# Patient Record
Sex: Male | Born: 1955 | Race: White | Hispanic: No | Marital: Married | Smoking: Former smoker
Health system: Southern US, Community
[De-identification: ages and names within clinical notes are randomized; demographics above are authoritative.]

## PROBLEM LIST (undated history)

## (undated) DIAGNOSIS — I10 Essential (primary) hypertension: Secondary | ICD-10-CM

## (undated) DIAGNOSIS — M1990 Unspecified osteoarthritis, unspecified site: Secondary | ICD-10-CM

## (undated) DIAGNOSIS — J189 Pneumonia, unspecified organism: Secondary | ICD-10-CM

## (undated) HISTORY — PX: COLONOSCOPY: SHX174

## (undated) HISTORY — PX: SHOULDER SURGERY: SHX246

---

## 2003-01-03 ENCOUNTER — Ambulatory Visit (HOSPITAL_BASED_OUTPATIENT_CLINIC_OR_DEPARTMENT_OTHER): Admission: RE | Admit: 2003-01-03 | Discharge: 2003-01-03 | Payer: Self-pay | Admitting: Orthopaedic Surgery

## 2005-08-12 ENCOUNTER — Ambulatory Visit: Payer: Self-pay | Admitting: Family Medicine

## 2005-09-03 ENCOUNTER — Ambulatory Visit: Payer: Self-pay | Admitting: Family Medicine

## 2010-06-12 ENCOUNTER — Encounter: Admission: RE | Admit: 2010-06-12 | Discharge: 2010-06-12 | Payer: Self-pay | Admitting: Internal Medicine

## 2011-01-17 NOTE — Op Note (Signed)
Tommy Jones, Tommy Jones                            ACCOUNT NO.:  [id number]   MEDICAL RECORD NO.:  [id number]                   PATIENT TYPE:  AMB   LOCATION:  DSC                                  FACILITY:  [facility]   PHYSICIAN:  Lubertha Basque. Jerl Santos, M.D.             DATE OF BIRTH:  Apr 30, 1956   DATE OF PROCEDURE:  01/03/2003  DATE OF DISCHARGE:                                 OPERATIVE REPORT   PREOPERATIVE DIAGNOSES:  1. Right shoulder impingement.  2. Right shoulder acromioclavicular pain.  3. Right shoulder degenerative joint disease.   POSTOPERATIVE DIAGNOSES:  1. Right shoulder impingement.  2. Right shoulder acromioclavicular pain.  3. Right shoulder degenerative joint disease.   PROCEDURE:  1. Right shoulder arthroscopic acromioplasty.  2. Right shoulder arthroscopic acromioclavicular resection.  3. Right shoulder arthroscopic debridement and removal loose body.   ANESTHESIA:  General.   SURGEON:  Marcene Corning, M.D.   ASSISTANT:  Laray Anger, P.A.   INDICATIONS FOR PROCEDURE:  The patient is a 55 year-old male with a long  history of bilateral shoulder difficulty.  He has responded in the past in a  transient way to subacromial injection on the right where he has pain  consistent with impingement.  He also has some AC pain.  On the left side he  has endstage degenerative change with lesser changes seen on this operative  right side.  With pain at rest and pain with activity and specifically  difficulty throwing, he is offered an arthroscopy.  Informed operative  consent was obtained after discussion of possible complications of reaction  to anesthesia and infection.   DESCRIPTION OF PROCEDURE:  The patient was taken to the operating suite  where general anesthetic is applied without difficulty.  He was positioned  in beach chair position and prepped and draped in normal sterile fashion.  After the administration of preoperative antibiotics an arthroscopy of the  right shoulder was performed through a total of three portals.  Glenohumeral  joint did show some significant degenerative change which was focally grade  4 and in a broader area grade 3.  There was some loose articular flaps of  cartilage which we removed.  There was also evidence of some remodeling  going on with some fibrocartilage forming across his glenoid and to a lesser  degree across the humeral head.  Some loose bodies were removed the largest  of which measured about 4 mm in diameter.  The labrum appeared to be intact  and the biceps anchor was intact as well and stable.  The rotator cuff  appeared benign from below and the biceps tendon was traced out through the  rotator interval and was found to be benign across the intraarticular  course.  In the subacromial space he had a great deal of bursitis and  inflammation of the cuff but no frank tear.  He had a prominent subacromial  morphology addressed with an acromioplasty back to a flat surface.  This was  done with the burr in the lateral position followed by transfer of the burr  to the posterior position.  There was also bone-on-bone contact at the [facility]  joint and a formal ACB compression was done through the anterior portal.  The shoulder was thoroughly irrigated at the end of the case followed by  placement of Marcaine with epinephrine and morphine.  Simple sutures of  nylon were used to loosely reapproximate the portals followed by Adaptic and  a dry gauze dressing with tape.   ESTIMATED BLOOD LOSS:  Obtain from anesthesia records.   INTRAOPERATIVE FLUIDS:  Obtain from anesthesia records.   DISPOSITION:  The patient was extubated in the operating room and taken to  recovery in stable condition.  Plans were for him to go home the same day  and to follow up in the office in less than a week.  I will contact him by  phone tonight.                                               Lubertha Basque Jerl Santos, M.D.    PGD/MEDQ  D:   01/03/2003  T:  01/03/2003  Job:  [id number]

## 2013-12-08 ENCOUNTER — Ambulatory Visit
Admission: RE | Admit: 2013-12-08 | Discharge: 2013-12-08 | Disposition: A | Discharge status: Home / Self Care | Payer: BC Managed Care – PPO | Source: Ambulatory Visit | Attending: Internal Medicine | Admitting: Internal Medicine

## 2013-12-08 ENCOUNTER — Other Ambulatory Visit: Payer: Self-pay | Admitting: Internal Medicine

## 2013-12-08 DIAGNOSIS — M25559 Pain in unspecified hip: Secondary | ICD-10-CM

## 2013-12-08 DIAGNOSIS — M25552 Pain in left hip: Secondary | ICD-10-CM

## 2014-04-14 NOTE — H&P (Signed)
  [facility] [address]   [address] [place], [place] [id number] [phone] A Division of [facility]  Loreta Aveaniel F. Ivet Guerrieri, M.D.   Robert A. Thurston HoleWainer, M.D.   Burnell BlanksW. Dan Caffrey, M.D.   Eulas PostJoshua P. Landau, M.D.   Lunette StandsAnna Voytek, M.D Jewel Baizeimothy D. Eulah PontMurphy, M.D.  Buford DresserWesley R. Ibazebo, M.D.  Estell HarpinJames S. Kramer, M.D.    Melina Fiddlerebecca S. Bassett, M.D. Mary L. Isidoro DonningAnton, PA-C  Kirstin A. Shepperson, PA-C  Josh [place], PA-C StewardBrandon Parry, [place]A-C                                                                    RE: Tommy Jones, Steve                                    [phone]      DOB: 1955/10/21 PROGRESS NOTE: 02-20-14 Reason for visit: Referral from Dr. Thurston HoleWainer to evaluate left hip pain.   History of present illness: He has had progressive pain in his left hip at the groin for a year or so.  he has had one injection there.  He has been on NSAIDs.  He has pain daily that is limiting his ability to work.     Please see associated documentation for this clinic visit for further past medical, family, surgical and social history, review of systems, and exam findings as this was reviewed by me.  EXAMINATION: Well appearing male in no apparent distress.  He has a positive Stinchfield.  Pain with range of motion, but relatively well preserved.  Range of motion, he has good pulses in his left lower extremity.  He has no tenderness in his back.  Negative straight leg raise.  No knee pain.     X-RAYS: X-rays reviewed by me: plain films demonstrate severe arthritis of the left hip.  He also had an MRI which demonstrated a significant amount of signal within the head.  There is a question whether it was AVN versus just reactive changes to the arthritis.    ASSESSMENT/PLAN: Pleasant 58 year-old gentleman with severe arthritis of the left hip.  Unclear whether it was AVN or just reactive changes to the arthritis.  I discussed his options at this time.  We can repeat his injection,  although it only lasted for a week.  He can continue with NSAIDs.  I will give him some Ultram, but he would like to go forward with total hip arthroplasty.  I discussed the risks and benefits.  He would like to go forward with this.  Of note, he does have severe arthritis in the left shoulder and would like that eventually taken care of.  We will still need to get a better axillary x-ray and possibly a CT scan for surgical planning for that.    Jewel Baizeimothy D.  Eulah PontMurphy, M.D.  Electronically verified by Jewel Baizeimothy D. Eulah PontMurphy, M.D. TDM:jjh D 02-20-14 T 02-20-14

## 2014-04-21 ENCOUNTER — Other Ambulatory Visit (HOSPITAL_COMMUNITY): Payer: Self-pay | Admitting: *Deleted

## 2014-04-21 ENCOUNTER — Encounter (HOSPITAL_COMMUNITY)
Admission: RE | Admit: 2014-04-21 | Discharge: 2014-04-21 | Disposition: A | Discharge status: Home / Self Care | Payer: BC Managed Care – PPO | Source: Ambulatory Visit | Attending: Orthopedic Surgery | Admitting: Orthopedic Surgery

## 2014-04-21 ENCOUNTER — Encounter (HOSPITAL_COMMUNITY): Payer: Self-pay

## 2014-04-21 ENCOUNTER — Encounter (HOSPITAL_COMMUNITY)
Admission: RE | Admit: 2014-04-21 | Discharge: 2014-04-21 | Disposition: A | Discharge status: Home / Self Care | Payer: BC Managed Care – PPO | Source: Ambulatory Visit | Attending: Anesthesiology | Admitting: Anesthesiology

## 2014-04-21 DIAGNOSIS — M1610 Unilateral primary osteoarthritis, unspecified hip: Secondary | ICD-10-CM | POA: Diagnosis present

## 2014-04-21 DIAGNOSIS — Z01818 Encounter for other preprocedural examination: Secondary | ICD-10-CM | POA: Insufficient documentation

## 2014-04-21 DIAGNOSIS — M169 Osteoarthritis of hip, unspecified: Secondary | ICD-10-CM | POA: Diagnosis present

## 2014-04-21 HISTORY — DX: Essential (primary) hypertension: I10

## 2014-04-21 HISTORY — DX: Pneumonia, unspecified organism: J18.9

## 2014-04-21 HISTORY — DX: Unspecified osteoarthritis, unspecified site: M19.90

## 2014-04-21 LAB — CBC
HCT: 43.7 % (ref 39.0–52.0)
Hemoglobin: 15.3 g/dL (ref 13.0–17.0)
MCH: 33.6 pg (ref 26.0–34.0)
MCHC: 35 g/dL (ref 30.0–36.0)
MCV: 96 fL (ref 78.0–100.0)
Platelets: 238 10*3/uL (ref 150–400)
RBC: 4.55 MIL/uL (ref 4.22–5.81)
RDW: 12.5 % (ref 11.5–15.5)
WBC: 6.5 10*3/uL (ref 4.0–10.5)

## 2014-04-21 LAB — URINALYSIS, ROUTINE W REFLEX MICROSCOPIC
BILIRUBIN URINE: NEGATIVE
Glucose, UA: NEGATIVE mg/dL
Hgb urine dipstick: NEGATIVE
Ketones, ur: NEGATIVE mg/dL
Leukocytes, UA: NEGATIVE
NITRITE: NEGATIVE
PH: 6 (ref 5.0–8.0)
Protein, ur: NEGATIVE mg/dL
Specific Gravity, Urine: 1.024 (ref 1.005–1.030)
Urobilinogen, UA: 1 mg/dL (ref 0.0–1.0)

## 2014-04-21 LAB — BASIC METABOLIC PANEL
Anion gap: 12 (ref 5–15)
BUN: 19 mg/dL (ref 6–23)
CHLORIDE: 102 meq/L (ref 96–112)
CO2: 26 mEq/L (ref 19–32)
CREATININE: 0.67 mg/dL (ref 0.50–1.35)
Calcium: 9.4 mg/dL (ref 8.4–10.5)
GFR calc non Af Amer: >90 mL/min (ref >90)
GLUCOSE: 109 mg/dL — AB (ref 70–99)
Potassium: 4.2 mEq/L (ref 3.7–5.3)
Sodium: 140 mEq/L (ref 137–147)

## 2014-04-21 LAB — TYPE AND SCREEN
ABO/RH(D): O POS
Antibody Screen: NEGATIVE

## 2014-04-21 LAB — PROTIME-INR
INR: 1.06 (ref 0.00–1.49)
PROTHROMBIN TIME: 13.8 s (ref 11.6–15.2)

## 2014-04-21 LAB — SURGICAL PCR SCREEN
MRSA, PCR: NEGATIVE
Staphylococcus aureus: NEGATIVE

## 2014-04-21 LAB — ABO/RH: ABO/RH(D): O POS

## 2014-04-21 LAB — APTT: APTT: 36 s (ref 24–37)

## 2014-04-21 NOTE — Progress Notes (Signed)
04/21/14 1032  OBSTRUCTIVE SLEEP APNEA  Have you ever been diagnosed with sleep apnea through a sleep study? No  Do you snore loudly (loud enough to be heard through closed doors)?  0  Do you often feel tired, fatigued, or sleepy during the daytime? 0  Do you have, or are you being treated for high blood pressure? 1  BMI more than 35 kg/m2? 0  Age over 58 years old? 1  Neck circumference greater than 40 cm/16 inches? 1  Gender: 1 (17.5)  Obstructive Sleep Apnea Score 4  Score 4 or greater  Results sent to PCP

## 2014-04-21 NOTE — Progress Notes (Signed)
Pt's PCP is Dr. Johnella MoloneyNeville Gates. Stop Bang assessment sent to Dr. Kevan NyGates. Pt denies any cardiac history, chest pain or sob.

## 2014-04-21 NOTE — Pre-Procedure Instructions (Signed)
Tommy Jones  04/21/2014   Your procedure is scheduled on:  Tuesday, May 02, 2014 at 10:25 AM.   [facility] at 8:25 AM.   Call this number if you have problems the morning of surgery: [phone]   Remember:   Do not eat food or drink liquids after midnight Monday, 05/01/14.   Take these medicines the morning of surgery with A SIP OF WATER: omeprazole (PRILOSEC), traMADol (ULTRAM)  Stop NSAIDS (Mobic, Aleve, Ibuprofen, etc) as of Tuesday, 04/25/14.    Do not wear jewelry.  Do not wear lotions, powders, or cologne. You may wear deodorant.  Men may shave face and neck.  Do not bring valuables to the hospital.  [facility] is not responsible                  for any belongings or valuables.               Contacts, dentures or bridgework may not be worn into surgery.  Leave suitcase in the car. After surgery it may be brought to your room.  For patients admitted to the hospital, discharge time is determined by your                treatment team.            Special Instructions: [place] - Preparing for Surgery  Before surgery, you can play an important role.  Because skin is not sterile, your skin needs to be as free of germs as possible.  You can reduce the number of germs on you skin by washing with CHG (chlorahexidine gluconate) soap before surgery.  CHG is an antiseptic cleaner which kills germs and bonds with the skin to continue killing germs even after washing.  Please DO NOT use if you have an allergy to CHG or antibacterial soaps.  If your skin becomes reddened/irritated stop using the CHG and inform your nurse when you arrive at Short Stay.  Do not shave (including legs and underarms) for at least 48 hours prior to the first CHG shower.  You may shave your face.  Please follow these instructions carefully:   1.  Shower with CHG Soap the night before surgery and the                                morning of  Surgery.  2.  If you choose to wash your hair, wash your hair first as usual with your       normal shampoo.  3.  After you shampoo, rinse your hair and body thoroughly to remove the                      Shampoo.  4.  Use CHG as you would any other liquid soap.  You can apply chg directly       to the skin and wash gently with scrungie or a clean washcloth.  5.  Apply the CHG Soap to your body ONLY FROM THE NECK DOWN.        Do not use on open wounds or open sores.  Avoid contact with your eyes, ears, mouth and genitals (private parts).  Wash genitals (private parts) with your normal soap.  6.  Wash thoroughly, paying special attention to the area where your surgery        will be performed.  7.  Thoroughly rinse your body with warm water from the neck down.  8.  DO NOT shower/wash with your normal soap after using and rinsing off       the CHG Soap.  9.  Pat yourself dry with a clean towel.            10.  Wear clean pajamas.            11.  Place clean sheets on your bed the night of your first shower and do not        sleep with pets.  Day of Surgery  Do not apply any lotions the morning of surgery.  Please wear clean clothes to the hospital/surgery center.     Please read over the following fact sheets that you were given: Pain Booklet, Coughing and Deep Breathing, Blood Transfusion Information, MRSA Information and Surgical Site Infection Prevention

## 2014-05-01 MED ORDER — CEFAZOLIN SODIUM-DEXTROSE 2-3 GM-% IV SOLR
2.0000 g | INTRAVENOUS | Status: AC
  Administered 2014-05-02: 2 g via INTRAVENOUS
  Filled 2014-05-01: qty 50

## 2014-05-02 ENCOUNTER — Inpatient Hospital Stay (HOSPITAL_COMMUNITY)
Admission: RE | Admit: 2014-05-02 | Discharge: 2014-05-03 | DRG: 470 | Disposition: A | Discharge status: Home Health | Payer: BC Managed Care – PPO | Source: Ambulatory Visit | Attending: Orthopedic Surgery | Admitting: Orthopedic Surgery

## 2014-05-02 ENCOUNTER — Inpatient Hospital Stay (HOSPITAL_COMMUNITY): Payer: BC Managed Care – PPO

## 2014-05-02 ENCOUNTER — Inpatient Hospital Stay (HOSPITAL_COMMUNITY): Payer: BC Managed Care – PPO | Admitting: Anesthesiology

## 2014-05-02 ENCOUNTER — Encounter (HOSPITAL_COMMUNITY): Payer: Self-pay | Admitting: *Deleted

## 2014-05-02 ENCOUNTER — Encounter (HOSPITAL_COMMUNITY): Payer: BC Managed Care – PPO | Admitting: Anesthesiology

## 2014-05-02 ENCOUNTER — Encounter (HOSPITAL_COMMUNITY)
Admission: RE | Disposition: A | Discharge status: Home Health | Payer: Self-pay | Source: Ambulatory Visit | Attending: Orthopedic Surgery

## 2014-05-02 DIAGNOSIS — K219 Gastro-esophageal reflux disease without esophagitis: Secondary | ICD-10-CM | POA: Diagnosis present

## 2014-05-02 DIAGNOSIS — M169 Osteoarthritis of hip, unspecified: Secondary | ICD-10-CM | POA: Diagnosis present

## 2014-05-02 DIAGNOSIS — M25559 Pain in unspecified hip: Secondary | ICD-10-CM | POA: Diagnosis present

## 2014-05-02 DIAGNOSIS — M1990 Unspecified osteoarthritis, unspecified site: Secondary | ICD-10-CM | POA: Diagnosis present

## 2014-05-02 DIAGNOSIS — I10 Essential (primary) hypertension: Secondary | ICD-10-CM | POA: Diagnosis present

## 2014-05-02 DIAGNOSIS — M1610 Unilateral primary osteoarthritis, unspecified hip: Principal | ICD-10-CM | POA: Diagnosis present

## 2014-05-02 DIAGNOSIS — G8918 Other acute postprocedural pain: Secondary | ICD-10-CM | POA: Diagnosis not present

## 2014-05-02 DIAGNOSIS — Z7982 Long term (current) use of aspirin: Secondary | ICD-10-CM | POA: Diagnosis not present

## 2014-05-02 DIAGNOSIS — Z87891 Personal history of nicotine dependence: Secondary | ICD-10-CM | POA: Diagnosis not present

## 2014-05-02 DIAGNOSIS — Z79899 Other long term (current) drug therapy: Secondary | ICD-10-CM

## 2014-05-02 HISTORY — PX: TOTAL HIP ARTHROPLASTY: SHX124

## 2014-05-02 LAB — POCT I-STAT 4, (NA,K, GLUC, HGB,HCT)
Glucose, Bld: 135 mg/dL — ABNORMAL HIGH (ref 70–99)
HCT: 35 % — ABNORMAL LOW (ref 39.0–52.0)
Hemoglobin: 11.9 g/dL — ABNORMAL LOW (ref 13.0–17.0)
Potassium: 3.9 mEq/L (ref 3.7–5.3)
SODIUM: 138 meq/L (ref 137–147)

## 2014-05-02 SURGERY — ARTHROPLASTY, HIP, TOTAL, ANTERIOR APPROACH
Anesthesia: General | Site: Hip | Laterality: Left

## 2014-05-02 MED ORDER — DEXTROSE-NACL 5-0.45 % IV SOLN
INTRAVENOUS | Status: DC
  Administered 2014-05-03: 05:00:00 via INTRAVENOUS

## 2014-05-02 MED ORDER — DEXMEDETOMIDINE HCL 200 MCG/2ML IV SOLN
INTRAVENOUS | Status: DC | PRN
  Administered 2014-05-02: 8 ug via INTRAVENOUS

## 2014-05-02 MED ORDER — DEXAMETHASONE 6 MG PO TABS
10.0000 mg | ORAL_TABLET | Freq: Three times a day (TID) | ORAL | Status: DC
  Administered 2014-05-03: 10 mg via ORAL
  Filled 2014-05-02 (×3): qty 1

## 2014-05-02 MED ORDER — PROPOFOL 10 MG/ML IV BOLUS
INTRAVENOUS | Status: DC | PRN
  Administered 2014-05-02: 150 mg via INTRAVENOUS
  Administered 2014-05-02: 50 mg via INTRAVENOUS

## 2014-05-02 MED ORDER — MORPHINE SULFATE 2 MG/ML IJ SOLN
2.0000 mg | INTRAMUSCULAR | Status: DC | PRN
  Administered 2014-05-02: 2 mg via INTRAVENOUS

## 2014-05-02 MED ORDER — HYDROMORPHONE HCL PF 1 MG/ML IJ SOLN
INTRAMUSCULAR | Status: AC
  Filled 2014-05-02: qty 1

## 2014-05-02 MED ORDER — MEPERIDINE HCL 25 MG/ML IJ SOLN: 6.2500 mg | INTRAMUSCULAR | Status: DC | PRN

## 2014-05-02 MED ORDER — ONDANSETRON HCL 4 MG/2ML IJ SOLN
4.0000 mg | Freq: Four times a day (QID) | INTRAMUSCULAR | Status: DC | PRN
  Administered 2014-05-02: 4 mg via INTRAVENOUS
  Filled 2014-05-02: qty 2

## 2014-05-02 MED ORDER — PHENYLEPHRINE 40 MCG/ML (10ML) SYRINGE FOR IV PUSH (FOR BLOOD PRESSURE SUPPORT)
PREFILLED_SYRINGE | INTRAVENOUS | Status: AC
  Filled 2014-05-02: qty 10

## 2014-05-02 MED ORDER — CELECOXIB 200 MG PO CAPS
200.0000 mg | ORAL_CAPSULE | Freq: Two times a day (BID) | ORAL | Status: DC
  Administered 2014-05-02 – 2014-05-03 (×2): 200 mg via ORAL
  Filled 2014-05-02 (×4): qty 1

## 2014-05-02 MED ORDER — IRBESARTAN 75 MG PO TABS
75.0000 mg | ORAL_TABLET | Freq: Every day | ORAL | Status: DC
Start: 2014-05-02 — End: 2014-05-03
  Administered 2014-05-02 – 2014-05-03 (×2): 75 mg via ORAL
  Filled 2014-05-02 (×3): qty 1

## 2014-05-02 MED ORDER — ONDANSETRON HCL 4 MG/2ML IJ SOLN
INTRAMUSCULAR | Status: AC
  Filled 2014-05-02: qty 2

## 2014-05-02 MED ORDER — METHOCARBAMOL 500 MG PO TABS
ORAL_TABLET | ORAL | Status: AC
  Filled 2014-05-02: qty 1

## 2014-05-02 MED ORDER — ONDANSETRON HCL 4 MG PO TABS: 4.0000 mg | ORAL_TABLET | Freq: Four times a day (QID) | ORAL | Status: DC | PRN

## 2014-05-02 MED ORDER — PROPOFOL 10 MG/ML IV BOLUS
INTRAVENOUS | Status: AC
  Filled 2014-05-02: qty 20

## 2014-05-02 MED ORDER — HYDROMORPHONE HCL PF 1 MG/ML IJ SOLN
0.5000 mg | INTRAMUSCULAR | Status: AC | PRN
  Administered 2014-05-02 (×2): 0.5 mg via INTRAVENOUS

## 2014-05-02 MED ORDER — MORPHINE SULFATE 2 MG/ML IJ SOLN
INTRAMUSCULAR | Status: AC
  Filled 2014-05-02: qty 1

## 2014-05-02 MED ORDER — ROCURONIUM BROMIDE 100 MG/10ML IV SOLN
INTRAVENOUS | Status: DC | PRN
  Administered 2014-05-02: 50 mg via INTRAVENOUS

## 2014-05-02 MED ORDER — METOCLOPRAMIDE HCL 5 MG PO TABS
5.0000 mg | ORAL_TABLET | Freq: Three times a day (TID) | ORAL | Status: DC | PRN
  Filled 2014-05-02: qty 2

## 2014-05-02 MED ORDER — ACETAMINOPHEN 325 MG PO TABS: 650.0000 mg | ORAL_TABLET | Freq: Four times a day (QID) | ORAL | Status: DC | PRN

## 2014-05-02 MED ORDER — PROMETHAZINE HCL 25 MG/ML IJ SOLN: 6.2500 mg | INTRAMUSCULAR | Status: DC | PRN

## 2014-05-02 MED ORDER — CEFAZOLIN SODIUM-DEXTROSE 2-3 GM-% IV SOLR
2.0000 g | Freq: Four times a day (QID) | INTRAVENOUS | Status: AC
  Administered 2014-05-02 – 2014-05-03 (×2): 2 g via INTRAVENOUS
  Filled 2014-05-02 (×2): qty 50

## 2014-05-02 MED ORDER — 0.9 % SODIUM CHLORIDE (POUR BTL) OPTIME
TOPICAL | Status: DC | PRN
  Administered 2014-05-02: 1000 mL

## 2014-05-02 MED ORDER — GLYCOPYRROLATE 0.2 MG/ML IJ SOLN
INTRAMUSCULAR | Status: DC | PRN
  Administered 2014-05-02: .8 mg via INTRAVENOUS

## 2014-05-02 MED ORDER — MIDAZOLAM HCL 5 MG/5ML IJ SOLN
INTRAMUSCULAR | Status: DC | PRN
  Administered 2014-05-02: 2 mg via INTRAVENOUS

## 2014-05-02 MED ORDER — DEXTROSE-NACL 5-0.45 % IV SOLN
100.0000 mL/h | INTRAVENOUS | Status: DC
Start: 2014-05-02 — End: 2014-05-02

## 2014-05-02 MED ORDER — TRANEXAMIC ACID 100 MG/ML IV SOLN
1000.0000 mg | INTRAVENOUS | Status: AC
  Administered 2014-05-02: 1000 mg via INTRAVENOUS
  Filled 2014-05-02: qty 10

## 2014-05-02 MED ORDER — MIDAZOLAM HCL 2 MG/2ML IJ SOLN
INTRAMUSCULAR | Status: AC
  Filled 2014-05-02: qty 2

## 2014-05-02 MED ORDER — FENTANYL CITRATE 0.05 MG/ML IJ SOLN
INTRAMUSCULAR | Status: AC
  Filled 2014-05-02: qty 2

## 2014-05-02 MED ORDER — NEOSTIGMINE METHYLSULFATE 10 MG/10ML IV SOLN
INTRAVENOUS | Status: AC
  Filled 2014-05-02: qty 1

## 2014-05-02 MED ORDER — LACTATED RINGERS IV SOLN
INTRAVENOUS | Status: DC
  Administered 2014-05-02: 50 mL/h via INTRAVENOUS

## 2014-05-02 MED ORDER — NEOSTIGMINE METHYLSULFATE 10 MG/10ML IV SOLN
INTRAVENOUS | Status: DC | PRN
  Administered 2014-05-02: 5 mg via INTRAVENOUS

## 2014-05-02 MED ORDER — DOCUSATE SODIUM 100 MG PO CAPS
100.0000 mg | ORAL_CAPSULE | Freq: Two times a day (BID) | ORAL | Status: DC
  Administered 2014-05-02 – 2014-05-03 (×2): 100 mg via ORAL
  Filled 2014-05-02 (×2): qty 1

## 2014-05-02 MED ORDER — HYDROCODONE-ACETAMINOPHEN 5-325 MG PO TABS
1.0000 | ORAL_TABLET | ORAL | Status: DC | PRN
  Administered 2014-05-02 – 2014-05-03 (×3): 2 via ORAL
  Filled 2014-05-02 (×3): qty 2

## 2014-05-02 MED ORDER — ACETAMINOPHEN 10 MG/ML IV SOLN
1000.0000 mg | Freq: Once | INTRAVENOUS | Status: AC
  Administered 2014-05-02: 1000 mg via INTRAVENOUS
  Filled 2014-05-02: qty 100

## 2014-05-02 MED ORDER — ONDANSETRON HCL 4 MG/2ML IJ SOLN
INTRAMUSCULAR | Status: DC | PRN
  Administered 2014-05-02: 4 mg via INTRAVENOUS

## 2014-05-02 MED ORDER — DEXAMETHASONE SODIUM PHOSPHATE 10 MG/ML IJ SOLN
INTRAMUSCULAR | Status: AC
  Filled 2014-05-02: qty 1

## 2014-05-02 MED ORDER — DEXAMETHASONE SODIUM PHOSPHATE 10 MG/ML IJ SOLN
10.0000 mg | Freq: Three times a day (TID) | INTRAMUSCULAR | Status: DC
  Administered 2014-05-02: 10 mg via INTRAVENOUS
  Filled 2014-05-02 (×3): qty 1

## 2014-05-02 MED ORDER — DEXAMETHASONE SODIUM PHOSPHATE 10 MG/ML IJ SOLN
INTRAMUSCULAR | Status: DC | PRN
  Administered 2014-05-02: 4 mg via INTRAVENOUS

## 2014-05-02 MED ORDER — ARTIFICIAL TEARS OP OINT
TOPICAL_OINTMENT | OPHTHALMIC | Status: DC | PRN
  Administered 2014-05-02: 1 via OPHTHALMIC

## 2014-05-02 MED ORDER — LIDOCAINE HCL (CARDIAC) 20 MG/ML IV SOLN
INTRAVENOUS | Status: AC
  Filled 2014-05-02: qty 5

## 2014-05-02 MED ORDER — PANTOPRAZOLE SODIUM 40 MG PO TBEC
40.0000 mg | DELAYED_RELEASE_TABLET | Freq: Every day | ORAL | Status: DC
  Administered 2014-05-02 – 2014-05-03 (×2): 40 mg via ORAL
  Filled 2014-05-02 (×2): qty 1

## 2014-05-02 MED ORDER — DEXAMETHASONE SODIUM PHOSPHATE 4 MG/ML IJ SOLN
INTRAMUSCULAR | Status: AC
  Filled 2014-05-02: qty 1

## 2014-05-02 MED ORDER — DEXTROSE 5 % IV SOLN
500.0000 mg | Freq: Four times a day (QID) | INTRAVENOUS | Status: DC | PRN
  Filled 2014-05-02: qty 5

## 2014-05-02 MED ORDER — METHOCARBAMOL 500 MG PO TABS
500.0000 mg | ORAL_TABLET | Freq: Four times a day (QID) | ORAL | Status: DC | PRN
  Administered 2014-05-02: 500 mg via ORAL
  Filled 2014-05-02: qty 1

## 2014-05-02 MED ORDER — ACETAMINOPHEN 650 MG RE SUPP: 650.0000 mg | Freq: Four times a day (QID) | RECTAL | Status: DC | PRN

## 2014-05-02 MED ORDER — FENTANYL CITRATE 0.05 MG/ML IJ SOLN
INTRAMUSCULAR | Status: DC | PRN
  Administered 2014-05-02: 100 ug via INTRAVENOUS
  Administered 2014-05-02 (×2): 50 ug via INTRAVENOUS
  Administered 2014-05-02 (×3): 100 ug via INTRAVENOUS

## 2014-05-02 MED ORDER — FENTANYL CITRATE 0.05 MG/ML IJ SOLN
25.0000 ug | INTRAMUSCULAR | Status: DC | PRN
  Administered 2014-05-02: 50 ug via INTRAVENOUS
  Administered 2014-05-02 (×2): 25 ug via INTRAVENOUS

## 2014-05-02 MED ORDER — SODIUM CHLORIDE 0.9 % IV SOLN
INTRAVENOUS | Status: DC | PRN
  Administered 2014-05-02: 11:00:00

## 2014-05-02 MED ORDER — ACETAMINOPHEN 500 MG PO TABS
ORAL_TABLET | ORAL | Status: AC
  Filled 2014-05-02: qty 2

## 2014-05-02 MED ORDER — PHENYLEPHRINE HCL 10 MG/ML IJ SOLN
INTRAMUSCULAR | Status: DC | PRN
  Administered 2014-05-02: 80 ug via INTRAVENOUS
  Administered 2014-05-02 (×2): 40 ug via INTRAVENOUS

## 2014-05-02 MED ORDER — BUPIVACAINE LIPOSOME 1.3 % IJ SUSP
40.0000 mL | Freq: Once | INTRAMUSCULAR | Status: DC
  Filled 2014-05-02: qty 40

## 2014-05-02 MED ORDER — GLYCOPYRROLATE 0.2 MG/ML IJ SOLN
INTRAMUSCULAR | Status: AC
  Filled 2014-05-02: qty 4

## 2014-05-02 MED ORDER — ALBUMIN HUMAN 5 % IV SOLN
INTRAVENOUS | Status: DC | PRN
  Administered 2014-05-02 (×3): via INTRAVENOUS

## 2014-05-02 MED ORDER — PHENOL 1.4 % MT LIQD
1.0000 | OROMUCOSAL | Status: DC | PRN
Start: 2014-05-02 — End: 2014-05-03

## 2014-05-02 MED ORDER — FENTANYL CITRATE 0.05 MG/ML IJ SOLN
INTRAMUSCULAR | Status: AC
  Filled 2014-05-02: qty 5

## 2014-05-02 MED ORDER — MENTHOL 3 MG MT LOZG: 1.0000 | LOZENGE | OROMUCOSAL | Status: DC | PRN

## 2014-05-02 MED ORDER — LIDOCAINE HCL (CARDIAC) 20 MG/ML IV SOLN
INTRAVENOUS | Status: DC | PRN
  Administered 2014-05-02: 100 mg via INTRAVENOUS

## 2014-05-02 MED ORDER — ACETAMINOPHEN 500 MG PO TABS
1000.0000 mg | ORAL_TABLET | Freq: Once | ORAL | Status: AC
  Administered 2014-05-02: 1000 mg via ORAL

## 2014-05-02 MED ORDER — LACTATED RINGERS IV SOLN
INTRAVENOUS | Status: DC | PRN
  Administered 2014-05-02 (×2): via INTRAVENOUS

## 2014-05-02 MED ORDER — METOCLOPRAMIDE HCL 5 MG/ML IJ SOLN
5.0000 mg | Freq: Three times a day (TID) | INTRAMUSCULAR | Status: DC | PRN
  Administered 2014-05-02: 10 mg via INTRAVENOUS
  Filled 2014-05-02 (×2): qty 2

## 2014-05-02 MED ORDER — ASPIRIN EC 325 MG PO TBEC
325.0000 mg | DELAYED_RELEASE_TABLET | Freq: Every day | ORAL | Status: DC
  Administered 2014-05-03: 325 mg via ORAL
  Filled 2014-05-02 (×3): qty 1

## 2014-05-02 MED ORDER — CHLORHEXIDINE GLUCONATE 4 % EX LIQD: 60.0000 mL | Freq: Once | CUTANEOUS | Status: DC

## 2014-05-02 SURGICAL SUPPLY — 58 items
BLADE SAW SGTL 18X1.27X75 (BLADE) ×2 IMPLANT
CLSR STERI-STRIP ANTIMIC 1/2X4 (GAUZE/BANDAGES/DRESSINGS) ×2 IMPLANT
COVER SURGICAL LIGHT HANDLE (MISCELLANEOUS) ×2 IMPLANT
DRAPE C-ARM 42X72 X-RAY (DRAPES) ×4 IMPLANT
DRAPE IMP U-DRAPE 54X76 (DRAPES) ×2 IMPLANT
DRAPE INCISE IOBAN 66X45 STRL (DRAPES) ×2 IMPLANT
DRAPE ORTHO SPLIT 77X108 STRL (DRAPES) ×2
DRAPE PROXIMA HALF (DRAPES) ×2 IMPLANT
DRAPE SURG 17X23 STRL (DRAPES) ×2 IMPLANT
DRAPE SURG ORHT 6 SPLT 77X108 (DRAPES) ×2 IMPLANT
DRAPE U-SHAPE 47X51 STRL (DRAPES) ×2 IMPLANT
DRSG AQUACEL AG ADV 3.5X10 (GAUZE/BANDAGES/DRESSINGS) ×2 IMPLANT
DURAPREP 26ML APPLICATOR (WOUND CARE) ×2 IMPLANT
ELECT BLADE 4.0 EZ CLEAN MEGAD (MISCELLANEOUS) ×2
ELECT CAUTERY BLADE 6.4 (BLADE) ×2 IMPLANT
ELECT REM PT RETURN 9FT ADLT (ELECTROSURGICAL) ×2
ELECTRODE BLDE 4.0 EZ CLN MEGD (MISCELLANEOUS) ×1 IMPLANT
ELECTRODE REM PT RTRN 9FT ADLT (ELECTROSURGICAL) ×1 IMPLANT
FACESHIELD WRAPAROUND (MASK) ×2 IMPLANT
GLOVE BIO SURGEON STRL SZ7.5 (GLOVE) ×2 IMPLANT
GLOVE BIOGEL M 7.0 STRL (GLOVE) IMPLANT
GLOVE BIOGEL PI IND STRL 7.5 (GLOVE) IMPLANT
GLOVE BIOGEL PI IND STRL 8 (GLOVE) ×2 IMPLANT
GLOVE BIOGEL PI INDICATOR 7.5 (GLOVE)
GLOVE BIOGEL PI INDICATOR 8 (GLOVE) ×2
GLOVE BIOGEL PI ORTHO PRO SZ8 (GLOVE)
GLOVE PI ORTHO PRO STRL SZ8 (GLOVE) IMPLANT
GLOVE SURG ORTHO 8.0 STRL STRW (GLOVE) ×2 IMPLANT
GOWN STRL REUS W/ TWL LRG LVL3 (GOWN DISPOSABLE) ×4 IMPLANT
GOWN STRL REUS W/ TWL XL LVL3 (GOWN DISPOSABLE) ×1 IMPLANT
GOWN STRL REUS W/TWL LRG LVL3 (GOWN DISPOSABLE) ×8
GOWN STRL REUS W/TWL XL LVL3 (GOWN DISPOSABLE) ×1
HEAD BIOLOX HIP 36/-2.5 (Joint) ×1 IMPLANT
HIP BIOLOX HD 36/-2.5 (Joint) ×2 IMPLANT
HIP/CERM HD VIT E LINR LEV 1C ×2 IMPLANT
IMMOBILIZER KNEE 22 UNIV (SOFTGOODS) ×2 IMPLANT
INSERT TRIDENT POLY 36MM 0DEG (Insert) ×4 IMPLANT
KIT BASIN OR (CUSTOM PROCEDURE TRAY) ×2 IMPLANT
KIT ROOM TURNOVER OR (KITS) ×2 IMPLANT
MANIFOLD NEPTUNE II (INSTRUMENTS) ×2 IMPLANT
NDL SAFETY ECLIPSE 18X1.5 (NEEDLE) ×1 IMPLANT
NEEDLE 18GX1X1/2 (RX/OR ONLY) (NEEDLE) ×2 IMPLANT
NEEDLE HYPO 18GX1.5 SHARP (NEEDLE) ×1
NS IRRIG 1000ML POUR BTL (IV SOLUTION) ×2 IMPLANT
PACK TOTAL JOINT (CUSTOM PROCEDURE TRAY) ×2 IMPLANT
PAD ARMBOARD 7.5X6 YLW CONV (MISCELLANEOUS) ×4 IMPLANT
SPONGE LAP 18X18 X RAY DECT (DISPOSABLE) IMPLANT
SUT MNCRL AB 4-0 PS2 18 (SUTURE) ×2 IMPLANT
SUT MON AB 2-0 CT1 36 (SUTURE) ×2 IMPLANT
SUT VIC AB 0 CT1 27 (SUTURE) ×2
SUT VIC AB 0 CT1 27XBRD ANBCTR (SUTURE) ×1 IMPLANT
SUT VIC AB 1 CT1 27 (SUTURE) ×1
SUT VIC AB 1 CT1 27XBRD ANBCTR (SUTURE) ×1 IMPLANT
SYR 50ML LL SCALE MARK (SYRINGE) ×2 IMPLANT
TOWEL OR 17X24 6PK STRL BLUE (TOWEL DISPOSABLE) ×2 IMPLANT
TOWEL OR 17X26 10 PK STRL BLUE (TOWEL DISPOSABLE) ×2 IMPLANT
TOWEL OR NON WOVEN STRL DISP B (DISPOSABLE) ×2 IMPLANT
TRAY CATH 16FR W/PLASTIC CATH (SET/KITS/TRAYS/PACK) ×2 IMPLANT

## 2014-05-02 NOTE — Interval H&P Note (Signed)
History and Physical Interval Note:  05/02/2014 7:24 AM  Tommy Jones  has presented today for surgery, with the diagnosis of OA LEFT HIP  The various methods of treatment have been discussed with the patient and family. After consideration of risks, benefits and other options for treatment, the patient has consented to  Procedure(s): TOTAL HIP ARTHROPLASTY ANTERIOR APPROACH (Left) as a surgical intervention .  The patient's history has been reviewed, patient examined, no change in status, stable for surgery.  I have reviewed the patient's chart and labs.  Questions were answered to the patient's satisfaction.     Dequincy Born, D

## 2014-05-02 NOTE — Op Note (Signed)
05/02/2014  1:07 PM  PATIENT:  Tommy Jones   MRN: [id number]  PRE-OPERATIVE DIAGNOSIS:  OA LEFT HIP  POST-OPERATIVE DIAGNOSIS:  OA LEFT HIP  PROCEDURE:  Procedure(s): TOTAL HIP ARTHROPLASTY ANTERIOR APPROACH  PREOPERATIVE INDICATIONS:    Tommy Jones is an 58 y.o. male who has a diagnosis of <principal problem not specified> and elected for surgical management after failing conservative treatment.  The risks benefits and alternatives were discussed with the patient including but not limited to the risks of nonoperative treatment, versus surgical intervention including infection, bleeding, nerve injury, periprosthetic fracture, the need for revision surgery, dislocation, leg length discrepancy, blood clots, cardiopulmonary complications, morbidity, mortality, among others, and they were willing to proceed.     OPERATIVE REPORT     SURGEON:   Renette Butters, MD    ASSISTANT:  Joya Gaskins OPA     ANESTHESIA:  General    COMPLICATIONS:  None.     COMPONENTS:  Stryker acolade fit femur size 3 with a 36 mm +7.5 head ball and a PSL acetabular shell size 52 with a  polyethylene liner    PROCEDURE IN DETAIL:   The patient was met in the holding area and  identified.  The appropriate hip was identified and marked at the operative site.  The patient was then transported to the OR  and  placed under general anesthesia.  At that point, the patient was  placed in the supine position and  secured to the operating room table and all bony prominences padded. He received pre-operative antibiotics    The operative lower extremity was prepped from the iliac crest to the distal leg.  Sterile draping was performed.  Time out was performed prior to incision.      Skin incision was made just 3 cm distal to the ASIS and 3 cm posterior to it extending in line with the tensor fascia lata. Electrocautery was used to control all bleeders. I dissected down sharply to the fascia of the tensor fascia  lata was confirmed that the muscle fibers beneath were running posteriorly. I then incised the fascia over the superficial tensor fascia lata in line with the incision. The fascia was elevated off the anterior aspect of the muscle the muscle was retracted posteriorly and protected throughout the case. I then used electrocautery to incise the tensor fascia lata fascia control and all bleeders. Immediately visible was the fat over top of the anterior neck and capsule.  I removed the anterior fat from the capsule and elevated the rectus muscle off of the anterior capsule. I then removed a large time of capsule. The retractors were then placed over the anterior acetabulum as well as around the superior and inferior neck.  I then removed a section of the femoral neck and a napkin ring fashion. Then used the power course to remove the femoral head from the acetabulum and thoroughly irrigated the acetabulum. I sized the femoral head.    I then exposed the deep acetabulum, cleared out any tissue including the ligamentum teres.   After adequate visualization, I excised the labrum, and then sequentially reamed.  I placed the trial acetabulum, which seated nicely, and then impacted the real cup into place.  Appropriate version and inclination was confirmed clinically matching their bony anatomy, and also with the use transverse acetabular ligament.  I then abducted the leg and released the external rotators from the posterior femur allowing it to be easily delivered up lateral and anterior to  the acetabulum for preparation of the femoral canal.    I then prepared the proximal femur using the cookie-cutter and then sequentially reamed and broached.  A trial broach, neck, and head was utilized, and I reduced the hip and it was found to have excellent stability with functional range of motion.  Unfortunately however at this point I noted that is testing the stability the acetabular component had loosened some. I  removed it with a polyethylene liner replaced acetabular liner but patient was coughing at this point causing increased anteversion so I recreate re\re placed in a spur time with appropriate version. I placed a screw in the posterior superior sites and had an excellent bite on that.  I then impacted the real femoral prosthesis into place into the appropriate version, slightly anteverted to the normal anatomy, and I impacted the real head ball into place. The hip was then reduced and taken through functional range of motion and found to have excellent stability. Leg lengths were restored.  I then irrigated the hip copiously again with, and repaired the fascia with Vicryl, followed by monocryl for the subcutaneous tissue, Monocryl for the skin, Steri-Strips and sterile gauze. The wounds were injected. The patient was then awakened and returned to PACU in stable and satisfactory condition. There were no complications.  POST OPERATIVE PLAN: WBAT, DVT px: SCD's/TED and ASA 325 daily  Tommy Lynch, MD Orthopedic Surgeon [phone]   05/02/2014 1:07 PM

## 2014-05-02 NOTE — Anesthesia Procedure Notes (Signed)
Procedure Name: Intubation Date/Time: 05/02/2014 10:32 AM Performed by: Reine Just Pre-anesthesia Checklist: Patient identified, Emergency Drugs available, Suction available, Patient being monitored and Timeout performed Patient Re-evaluated:Patient Re-evaluated prior to inductionOxygen Delivery Method: Circle system utilized and Simple face mask Preoxygenation: Pre-oxygenation with 100% oxygen Intubation Type: Combination inhalational/ intravenous induction Ventilation: Mask ventilation with difficulty, Two handed mask ventilation required and Oral airway inserted - appropriate to patient size Laryngoscope Size: Mac and 3 Grade View: Grade III Tube type: Oral Tube size: 7.5 mm Number of attempts: 1 Airway Equipment and Method: Patient positioned with wedge pillow and Stylet Placement Confirmation: ETT inserted through vocal cords under direct vision,  positive ETCO2 and breath sounds checked- equal and bilateral Secured at: 23 cm Tube secured with: Tape Dental Injury: Teeth and Oropharynx as per pre-operative assessment  Difficulty Due To: Difficulty was anticipated, Difficult Airway- due to anterior larynx and Difficult Airway- due to limited oral opening Future Recommendations: Recommend- induction with short-acting agent, and alternative techniques readily available Comments: Performed by Dr Berneice Heinrich

## 2014-05-02 NOTE — Anesthesia Postprocedure Evaluation (Signed)
  Anesthesia Post-op Note  Patient: Tommy Jones  Procedure(s) Performed: Procedure(s): TOTAL HIP ARTHROPLASTY ANTERIOR APPROACH (Left)  Patient Location: PACU  Anesthesia Type:General  Level of Consciousness: awake, alert , oriented and patient cooperative  Airway and Oxygen Therapy: Patient Spontanous Breathing and Patient connected to nasal cannula oxygen  Post-op Pain: moderate  Post-op Assessment: Post-op Vital signs reviewed, Patient's Cardiovascular Status Stable, Respiratory Function Stable and No signs of Nausea or vomiting  Post-op Vital Signs: Reviewed and stable  Last Vitals:  Filed Vitals:   05/02/14 1345  BP: 115/71  Pulse: 88  Temp: 36.4 C  Resp: 15    Complications: No apparent anesthesia complications

## 2014-05-02 NOTE — Interval H&P Note (Signed)
History and Physical Interval Note:  05/02/2014 9:52 AM  Tommy Jones  has presented today for surgery, with the diagnosis of OA LEFT HIP  The various methods of treatment have been discussed with the patient and family. After consideration of risks, benefits and other options for treatment, the patient has consented to  Procedure(s): TOTAL HIP ARTHROPLASTY ANTERIOR APPROACH (Left) as a surgical intervention .  The patient's history has been reviewed, patient examined, no change in status, stable for surgery.  I have reviewed the patient's chart and labs.  Questions were answered to the patient's satisfaction.     Adessa Primiano, D

## 2014-05-02 NOTE — Transfer of Care (Signed)
Immediate Anesthesia Transfer of Care Note  Patient: Tommy Jones  Procedure(s) Performed: Procedure(s): TOTAL HIP ARTHROPLASTY ANTERIOR APPROACH (Left)  Patient Location: PACU  Anesthesia Type:General  Level of Consciousness: awake and alert   Airway & Oxygen Therapy: Patient Spontanous Breathing and Patient connected to nasal cannula oxygen  Post-op Assessment: Report given to PACU RN, Post -op Vital signs reviewed and stable and Patient moving all extremities X 4  Post vital signs: Reviewed and stable  Complications: No apparent anesthesia complications

## 2014-05-02 NOTE — Progress Notes (Signed)
Patient stated he slipped down 3 steps yesterday  And hurt right knee, scraped left arm and left buttock.  Patient states he can walk on it.  Patient notified Dr. Eulah Pont.

## 2014-05-02 NOTE — Anesthesia Preprocedure Evaluation (Addendum)
Anesthesia Evaluation  Patient identified by MRN, date of birth, ID band Patient awake    Reviewed: Allergy & Precautions, H&P , NPO status , Patient's Chart, lab work & pertinent test results  Airway Mallampati: II TM Distance: >3 FB Neck ROM: Full    Dental  (+) Teeth Intact, Dental Advisory Given   Pulmonary pneumonia -, resolved, former smoker,  breath sounds clear to auscultation        Cardiovascular hypertension, Pt. on medications Rhythm:Regular Rate:Normal     Neuro/Psych    GI/Hepatic GERD-  ,  Endo/Other    Renal/GU      Musculoskeletal  (+) Arthritis -, Osteoarthritis,    Abdominal   Peds  Hematology   Anesthesia Other Findings   Reproductive/Obstetrics                        Anesthesia Physical Anesthesia Plan  ASA: III  Anesthesia Plan: General   Post-op Pain Management:    Induction: Intravenous  Airway Management Planned: LMA and Oral ETT  Additional Equipment:   Intra-op Plan:   Post-operative Plan:   Informed Consent: I have reviewed the patients History and Physical, chart, labs and discussed the procedure including the risks, benefits and alternatives for the proposed anesthesia with the patient or authorized representative who has indicated his/her understanding and acceptance.   Dental advisory given  Plan Discussed with: CRNA and Anesthesiologist  Anesthesia Plan Comments:        Anesthesia Quick Evaluation

## 2014-05-03 ENCOUNTER — Encounter (HOSPITAL_COMMUNITY): Payer: Self-pay | Admitting: Orthopedic Surgery

## 2014-05-03 MED ORDER — ASPIRIN EC 325 MG PO TBEC: 325.0000 mg | DELAYED_RELEASE_TABLET | Freq: Every day | ORAL | Status: DC

## 2014-05-03 MED ORDER — ONDANSETRON HCL 4 MG PO TABS: 4.0000 mg | ORAL_TABLET | Freq: Three times a day (TID) | ORAL | Status: DC | PRN

## 2014-05-03 MED ORDER — DOCUSATE SODIUM 100 MG PO CAPS: 100.0000 mg | ORAL_CAPSULE | Freq: Two times a day (BID) | ORAL | Status: DC

## 2014-05-03 MED ORDER — METHOCARBAMOL 500 MG PO TABS: 500.0000 mg | ORAL_TABLET | Freq: Four times a day (QID) | ORAL | Status: DC | PRN

## 2014-05-03 MED ORDER — HYDROCODONE-ACETAMINOPHEN 5-325 MG PO TABS: 1.0000 | ORAL_TABLET | ORAL | Status: DC | PRN

## 2014-05-03 MED ORDER — MELOXICAM 15 MG PO TABS: 15.0000 mg | ORAL_TABLET | Freq: Every day | ORAL | Status: DC

## 2014-05-03 NOTE — Care Management Note (Signed)
CARE MANAGEMENT NOTE 05/03/2014  Patient:  DEMONTREZ, RINDFLEISCH   Account Number:  [id number]  Date Initiated:  05/03/2014  Documentation initiated by:  Vance Peper  Subjective/Objective Assessment:   58 yr old male s/p left total hip arthroplasty.     Action/Plan:   Case manager spoke with patient and wife concerning home health and DME needs at discharge. Choice offered. Referral called to Hilda Lias, [facility] Liaison. patient going to recover @ [address] [place]  [phone]   Anticipated DC Date:  05/03/2014   Anticipated DC Plan:  HOME W HOME HEALTH SERVICES      DC Planning Services  CM consult      [facility] Choice  DURABLE MEDICAL EQUIPMENT  HOME HEALTH   Choice offered to / List presented to:  C-1 Patient   DME arranged  [facility]  3-N-1      DME agency  [employer]     HH arranged  HH-2 PT      HH agency  [facility]   Status of service:  Completed, signed off Medicare Important Message given?   (If response is "NO", the following Medicare IM given date fields will be blank) Date Medicare IM given:   Medicare IM given by:   Date Additional Medicare IM given:   Additional Medicare IM given by:    Discharge Disposition:  HOME W HOME HEALTH SERVICES  Per UR Regulation:  Reviewed for med. necessity/level of care/duration of stay

## 2014-05-03 NOTE — Evaluation (Signed)
Physical Therapy Evaluation Patient Details Name: Tommy Jones MRN: [id number] DOB: 08-Jan-1956 Today's Date: 05/03/2014   History of Present Illness  58 y.o. male s/p left total hip arthroplasty.  Clinical Impression  Pt is s/p left THA resulting in the deficits listed below (see PT Problem List). Safely completed stair training and ambulating up to 260 feet today at a supervision level while using a rolling walker. Feel patient is adequate for d/c home from a mobility standpoint and will have 24 hour care at home from family. Pt will benefit from skilled PT at home with HHPT to increase their independence and safety with mobility to allow discharge to the venue listed below.        Follow Up Recommendations Home health PT;Supervision for mobility/OOB    Equipment Recommendations  Rolling walker with 5" wheels;3in1 (PT)    Recommendations for Other Services OT consult     Precautions / Restrictions Precautions Precautions: None Precaution Comments: Direct anterior approach Restrictions Weight Bearing Restrictions: Yes LLE Weight Bearing: Weight bearing as tolerated      Mobility  Bed Mobility Overal bed mobility: Needs Assistance Bed Mobility: Supine to Sit     Supine to sit: Supervision     General bed mobility comments: VC for technique.  Transfers Overall transfer level: Needs assistance Equipment used: Rolling walker (2 wheeled) Transfers: Sit to/from Stand Sit to Stand: Supervision         General transfer comment: Supervision for safety, VC for technique and hand placement. Somewhat slow to rise however performs safely with apparent equal weight distribution through LEs. Good stability upon standing with RW.  Ambulation/Gait Ambulation/Gait assistance: Supervision Ambulation Distance (Feet): 260 Feet Assistive device: Rolling walker (2 wheeled) Gait Pattern/deviations: Step-through pattern;Decreased step length - right;Decreased stance time -  left;Antalgic Gait velocity: decreased   General Gait Details: Educated on safe DME use with rolling walker. VC for forward gaze and symmetry of step length. No loss of balance noted.  Stairs Stairs: Yes Stairs assistance: Supervision Stair Management: One rail Left;Step to pattern;Sideways Number of Stairs: 2 General stair comments: Demonstrated to patient prior to having him practice stair navigation. Performed this task safey and able to accurately teach back sequencing. He has no further questions reguarding this task.  Wheelchair Mobility    Modified Rankin (Stroke Patients Only)       Balance Overall balance assessment: Needs assistance Sitting-balance support: No upper extremity supported;Feet supported Sitting balance-Leahy Scale: Good     Standing balance support: No upper extremity supported Standing balance-Leahy Scale: Fair                               Pertinent Vitals/Pain Pain Assessment: No/denies pain ("Burn" over left hip )    Home Living Family/patient expects to be discharged to:: Private residence Living Arrangements: Spouse/significant other Available Help at Discharge: Family;Available 24 hours/day Type of Home: Apartment Home Access: Stairs to enter Entrance Stairs-Rails: Left Entrance Stairs-Number of Steps: 3 Home Layout: Two level;Able to live on main level with bedroom/bathroom Home Equipment: None      Prior Function Level of Independence: Independent               Hand Dominance   Dominant Hand: Right    Extremity/Trunk Assessment   Upper Extremity Assessment: Defer to OT evaluation           Lower Extremity Assessment: LLE deficits/detail   LLE Deficits / Details:  Decreased strength and ROM as expected post-op . normal light touch sensation bilaterally.     Communication   Communication: No difficulties  Cognition Arousal/Alertness: Awake/alert Behavior During Therapy: WFL for tasks  assessed/performed Overall Cognitive Status: Within Functional Limits for tasks assessed                      General Comments      Exercises Total Joint Exercises Ankle Circles/Pumps: AROM;Both;10 reps;Supine Heel Slides: Left;10 reps;Supine;AAROM Hip ABduction/ADduction: AAROM;Left;10 reps;Supine Long Arc Quad: Strengthening;Left;10 reps;Seated      Assessment/Plan    PT Assessment Patient needs continued PT services  PT Diagnosis Difficulty walking;Abnormality of gait;Acute pain   PT Problem List Decreased strength;Decreased range of motion;Decreased activity tolerance;Decreased balance;Decreased mobility;Decreased knowledge of use of DME;Pain  PT Treatment Interventions DME instruction;Gait training;Stair training;Functional mobility training;Therapeutic activities;Therapeutic exercise;Balance training;Neuromuscular re-education;Patient/family education;Modalities   PT Goals (Current goals can be found in the Care Plan section) Acute Rehab PT Goals Patient Stated Goal: Go home today PT Goal Formulation: With patient Time For Goal Achievement: 05/10/14 Potential to Achieve Goals: Good    Frequency 7X/week   Barriers to discharge        Co-evaluation               End of Session   Activity Tolerance: Patient tolerated treatment well Patient left: in chair;with call bell/phone within reach;with family/visitor present Nurse Communication: Mobility status         Time: 4098-1191 PT Time Calculation (min): 27 min   Charges:   PT Evaluation $Initial PT Evaluation Tier I: 1 Procedure PT Treatments $Gait Training: 8-22 mins   PT G Codes:         Charlsie Merles, [place] [phone]  [place] Mount 05/03/2014, 10:31 AM

## 2014-05-03 NOTE — Evaluation (Signed)
Occupational Therapy Evaluation Patient Details Name: Tommy Jones MRN: [id number] DOB: 1956-01-17 Today's Date: 05/03/2014    History of Present Illness 58 y.o. male s/p left total hip arthroplasty.   Clinical Impression   Patient evaluated by Occupational Therapy with no further acute OT needs identified. All education has been completed and the patient has no further questions. See below for any follow-up Occupational Therapy or equipment needs. OT to sign off. Thank you for referral.  All education is complete and patient indicates understanding.     Follow Up Recommendations  No OT follow up    Equipment Recommendations  Other (comment) (already in room)    Recommendations for Other Services       Precautions / Restrictions Precautions Precautions: None Precaution Comments: Direct anterior approach Restrictions LLE Weight Bearing: Weight bearing as tolerated      Mobility Bed Mobility               General bed mobility comments: no questions  Transfers Overall transfer level: Modified independent Equipment used: Rolling walker (2 wheeled) Transfers: Sit to/from Stand                Balance                                            ADL Overall ADL's : Needs assistance/impaired Eating/Feeding: Independent           Lower Body Bathing: Minimal assistance;Sit to/from stand (able to complete with caregiver (A))           Toilet Transfer: Supervision/safety;Ambulation;BSC;RW Toilet Transfer Details (indicate cue type and reason): demonstrates with 3n1 present in room ( delivered for take home)     Tub/ Shower Transfer: Therapist, sports Details (indicate cue type and reason): educated on use of bsc in shower as seat Functional mobility during ADLs: Supervision/safety;Rolling walker General ADL Comments: Pt educated on in and out of car transfer, pt educated on ambulation but limting to less than a  mile per day initially, educated on OOB for all meals, educated on dressing operated LE first, and educated on carrying objects iwth RW safely. Pt and wife without any further questions     Vision                     Perception     Praxis      Pertinent Vitals/Pain Pain Assessment: No/denies pain     Hand Dominance Right   Extremity/Trunk Assessment Upper Extremity Assessment Upper Extremity Assessment: Overall WFL for tasks assessed   Lower Extremity Assessment Lower Extremity Assessment: Defer to PT evaluation   Cervical / Trunk Assessment Cervical / Trunk Assessment: Normal   Communication Communication Communication: No difficulties   Cognition Arousal/Alertness: Awake/alert Behavior During Therapy: WFL for tasks assessed/performed Overall Cognitive Status: Within Functional Limits for tasks assessed                     General Comments       Exercises       Shoulder Instructions      Home Living Family/patient expects to be discharged to:: Private residence Living Arrangements: Spouse/significant other Available Help at Discharge: Family;Available 24 hours/day Type of Home: Apartment Home Access: Stairs to enter Entrance Stairs-Number of Steps: 3 Entrance Stairs-Rails: Left Home Layout: Two level;Able to live on main level with  bedroom/bathroom     Bathroom Shower/Tub: Producer, television/film/video: Standard     Home Equipment: None          Prior Functioning/Environment Level of Independence: Independent             OT Diagnosis:     OT Problem List:     OT Treatment/Interventions:      OT Goals(Current goals can be found in the care plan section) Acute Rehab OT Goals Patient Stated Goal: Go home today  OT Frequency:     Barriers to D/C:            Co-evaluation              End of Session Equipment Utilized During Treatment: Rolling walker Nurse Communication: Mobility  status;Precautions  Activity Tolerance: Patient tolerated treatment well Patient left: in chair;with call bell/phone within reach;with family/visitor present   Time: 1208-1222 OT Time Calculation (min): 14 min Charges:  OT General Charges $OT Visit: 1 Procedure OT Evaluation $Initial OT Evaluation Tier I: 1 Procedure OT Treatments $Self Care/Home Management : 8-22 mins G-Codes:    Tommy Jones 05-29-14, 2:02 PM Pager: [phone]

## 2014-05-03 NOTE — Discharge Instructions (Signed)
Keep your incision dry and covered  Follow PT instructions  Take Aspirin 325 daily for 30 days  Call the office with any concerns or questions

## 2014-05-03 NOTE — Progress Notes (Signed)
Utilization review completed.  

## 2014-05-04 NOTE — Discharge Summary (Signed)
Physician Discharge Summary  Patient ID: Tommy Jones MRN: [id number] DOB/AGE: Dec 08, 1955 58 y.o.  Admit date: 05/02/2014 Discharge date: 05/04/2014  Admission Diagnoses:  <principal problem not specified>  Discharge Diagnoses:  Active Problems:   DJD (degenerative joint disease)   Past Medical History  Diagnosis Date  . Hypertension   . Pneumonia   . Arthritis     Surgeries: Procedure(s): TOTAL HIP ARTHROPLASTY ANTERIOR APPROACH on 05/02/2014   Consultants (if any):    Discharged Condition: Improved  Hospital Course: Tommy Jones is an 58 y.o. male who was admitted 05/02/2014 with a diagnosis of <principal problem not specified> and went to the operating room on 05/02/2014 and underwent the above named procedures.    He was given perioperative antibiotics:  Anti-infectives   Start     Dose/Rate Route Frequency Ordered Stop   05/02/14 1900  ceFAZolin (ANCEF) IVPB 2 g/50 mL premix     2 g 100 mL/hr over 30 Minutes Intravenous Every 6 hours 05/02/14 1746 05/03/14 0206   05/02/14 0600  ceFAZolin (ANCEF) IVPB 2 g/50 mL premix     2 g 100 mL/hr over 30 Minutes Intravenous On call to O.R. 05/01/14 1413 05/02/14 1026    .  He was given sequential compression devices, early ambulation, and ASA 325 for DVT prophylaxis.  He benefited maximally from the hospital stay and there were no complications.    Recent vital signs:  Filed Vitals:   05/03/14 1311  BP: 128/77  Pulse: 78  Temp: 98.1 F (36.7 C)  Resp: 18    Recent laboratory studies:  Lab Results  Component Value Date   HGB 11.9* 05/02/2014   HGB 15.3 04/21/2014   Lab Results  Component Value Date   WBC 6.5 04/21/2014   PLT 238 04/21/2014   Lab Results  Component Value Date   INR 1.06 04/21/2014   Lab Results  Component Value Date   NA 138 05/02/2014   K 3.9 05/02/2014   CL 102 04/21/2014   CO2 26 04/21/2014   BUN 19 04/21/2014   CREATININE 0.67 04/21/2014   GLUCOSE 135* 05/02/2014    Discharge Medications:     Medication List    STOP taking these medications       traMADol 50 MG tablet  Commonly known as:  ULTRAM      TAKE these medications       aspirin EC 325 MG tablet  Take 1 tablet (325 mg total) by mouth daily.     docusate sodium 100 MG capsule  Commonly known as:  COLACE  Take 1 capsule (100 mg total) by mouth 2 (two) times daily. Continue this while taking narcotics to help with bowel movements     HYDROcodone-acetaminophen 5-325 MG per tablet  Commonly known as:  NORCO  Take 1-2 tablets by mouth every 4 (four) hours as needed for moderate pain.     meloxicam 15 MG tablet  Commonly known as:  MOBIC  Take 1 tablet (15 mg total) by mouth daily.     methocarbamol 500 MG tablet  Commonly known as:  ROBAXIN  Take 1 tablet (500 mg total) by mouth every 6 (six) hours as needed.     omeprazole 20 MG capsule  Commonly known as:  PRILOSEC  Take 20 mg by mouth daily.     ondansetron 4 MG tablet  Commonly known as:  ZOFRAN  Take 1 tablet (4 mg total) by mouth every 8 (eight) hours as needed for nausea.  valsartan 80 MG tablet  Commonly known as:  DIOVAN  Take 80 mg by mouth daily.        Diagnostic Studies: Dg Chest 2 View  04/21/2014   CLINICAL DATA:  Pre operative respiratory exam. Osteoarthritis of the left hip.  EXAM: CHEST  2 VIEW  COMPARISON:  None.  FINDINGS: The heart size and mediastinal contours are within normal limits. Both lungs are clear. No acute osseous abnormality. Arthritic changes of both shoulders with calcified loose bodies in the left shoulder joint.  IMPRESSION: No acute abnormalities.   Electronically Signed   By: Geanie Cooley M.D.   On: 04/21/2014 11:51   Dg Pelvis 1-2 Views  05/02/2014   CLINICAL DATA:  Anterior hip replacement.  EXAM: DG C-ARM 61-120 MIN; PELVIS - 1-2 VIEW  COMPARISON:  Left hip radiographs 12/08/2013  FINDINGS: Single frontal intraoperative spot fluoroscopic image of the pelvis is provided. Sequelae of interval left total hip  arthroplasty are identified. The prosthetic femoral and acetabular components are approximated on this single projection. The femoral prosthesis is incompletely visualized.  IMPRESSION: Intraoperative image during left total hip arthroplasty.   Electronically Signed   By: Sebastian Ache   On: 05/02/2014 14:35   Dg Pelvis Portable  05/02/2014   CLINICAL DATA:  Postop total hip arthroplasty.  EXAM: PORTABLE PELVIS 1-2 VIEWS  COMPARISON:  Intraoperative image earlier today. Left hip radiographs 12/08/2013.  FINDINGS: A left total hip arthroplasty is present. The prosthetic femoral and acetabular components are approximated on the single AP projection. No fracture is identified. Postoperative soft tissue gas is noted about the left hip.  IMPRESSION: Unremarkable immediate postoperative appearance of left total hip arthroplasty.   Electronically Signed   By: Sebastian Ache   On: 05/02/2014 15:04   Dg C-arm 1-60 Min  05/02/2014   CLINICAL DATA:  Anterior hip replacement.  EXAM: DG C-ARM 61-120 MIN; PELVIS - 1-2 VIEW  COMPARISON:  Left hip radiographs 12/08/2013  FINDINGS: Single frontal intraoperative spot fluoroscopic image of the pelvis is provided. Sequelae of interval left total hip arthroplasty are identified. The prosthetic femoral and acetabular components are approximated on this single projection. The femoral prosthesis is incompletely visualized.  IMPRESSION: Intraoperative image during left total hip arthroplasty.   Electronically Signed   By: Sebastian Ache   On: 05/02/2014 14:35    Disposition: 01-Home or Self Care        Follow-up Information   Follow up with Sheral Apley, MD. (as scheduled)    Specialty:  Orthopedic Surgery   Contact information:   [address] [place] [address] [phone]       Follow up with [facility]. (Someone from [facility] will contact you concerning start date and time for physical therapy)    Contact  information:   [address] [place] [address] [phone]        Signed: Sheral Apley 05/04/2014, 7:19 AM

## 2015-12-31 DEATH — deceased

## 2019-01-14 ENCOUNTER — Other Ambulatory Visit: Payer: Self-pay | Admitting: Orthopedic Surgery

## 2019-01-14 DIAGNOSIS — M25511 Pain in right shoulder: Secondary | ICD-10-CM

## 2019-01-22 ENCOUNTER — Ambulatory Visit
Admission: RE | Admit: 2019-01-22 | Discharge: 2019-01-22 | Disposition: A | Discharge status: Home / Self Care | Payer: 59 | Source: Ambulatory Visit | Attending: Orthopedic Surgery | Admitting: Orthopedic Surgery

## 2019-01-22 ENCOUNTER — Other Ambulatory Visit: Payer: Self-pay

## 2019-01-22 DIAGNOSIS — M25511 Pain in right shoulder: Secondary | ICD-10-CM

## 2019-02-07 DIAGNOSIS — I10 Essential (primary) hypertension: Secondary | ICD-10-CM | POA: Diagnosis present

## 2019-02-07 DIAGNOSIS — M19011 Primary osteoarthritis, right shoulder: Secondary | ICD-10-CM | POA: Diagnosis present

## 2019-02-07 DIAGNOSIS — M19012 Primary osteoarthritis, left shoulder: Secondary | ICD-10-CM | POA: Diagnosis present

## 2019-02-07 NOTE — H&P (Signed)
SHOULDER ARTHROPLASTY ADMISSION H&P  Patient ID: Julaine FusiSteven S Sconyers MRN: [id number] DOB/AGE: 63-Jan-1957 63 y.o.  Chief Complaint: right shoulder pain.  Planned Procedure Date: 02/22/19 Medical Clearance by Dr. Robley FriesNevill Gates   HPI: Julaine FusiSteven S Boullion is a 63 y.o. male who presents for evaluation of OA RIGHT SHOULDER. The patient has a history of pain and functional disability in the right shoulder due to arthritis and has failed non-surgical conservative treatments for greater than 12 weeks to include NSAID's and/or analgesics, corticosteriod injections and activity modification.  Onset of symptoms was gradual, starting 5 + years ago with gradually worsening course since that time.  Patient currently rates pain at 8 out of 10 with activity. Patient has night pain, worsening of pain with activity and weight bearing and pain that interferes with activities of daily living.  Patient has evidence of subchondral cysts, subchondral sclerosis, periarticular osteophytes, joint space narrowing by imaging studies.  There is no active infection.  Past Medical History:  Diagnosis Date  . Arthritis   . Hypertension   . Pneumonia    Past Surgical History:  Procedure Laterality Date  . COLONOSCOPY    . SHOULDER SURGERY Right    arthroscopic surgery   . TOTAL HIP ARTHROPLASTY Left 05/02/2014   Procedure: TOTAL HIP ARTHROPLASTY ANTERIOR APPROACH;  Surgeon: Sheral Apleyimothy D Murphy, MD;  Location: [facility] OR;  Service: Orthopedics;  Laterality: Left;   No Known Allergies   Prior to Admission medications   Medication Sig Start Date End Date Taking? Authorizing Provider  aspirin EC 325 MG tablet Take 1 tablet (325 mg total) by mouth daily. 05/03/14   Sheral ApleyMurphy, Timothy D, MD  docusate sodium (COLACE) 100 MG capsule Take 1 capsule (100 mg total) by mouth 2 (two) times daily. Continue this while taking narcotics to help with bowel movements 05/03/14   Sheral ApleyMurphy, Timothy D, MD  HYDROcodone-acetaminophen (NORCO) 5-325 MG per tablet Take 1-2  tablets by mouth every 4 (four) hours as needed for moderate pain. 05/03/14   Sheral ApleyMurphy, Timothy D, MD  meloxicam (MOBIC) 15 MG tablet Take 1 tablet (15 mg total) by mouth daily. 05/03/14   Sheral ApleyMurphy, Timothy D, MD  methocarbamol (ROBAXIN) 500 MG tablet Take 1 tablet (500 mg total) by mouth every 6 (six) hours as needed. 05/03/14   Sheral ApleyMurphy, Timothy D, MD  omeprazole (PRILOSEC) 20 MG capsule Take 20 mg by mouth daily.    [provider]  ondansetron (ZOFRAN) 4 MG tablet Take 1 tablet (4 mg total) by mouth every 8 (eight) hours as needed for nausea. 05/03/14   Sheral ApleyMurphy, Timothy D, MD  valsartan (DIOVAN) 80 MG tablet Take 80 mg by mouth daily.    [provider]   Social History   Socioeconomic History  . Marital status: Married    Spouse name: Not on file  . Number of children: Not on file  . Years of education: Not on file  . Highest education level: Not on file  Occupational History  . Not on file  Social Needs  . Financial resource strain: Not on file  . Food insecurity:    Worry: Not on file    Inability: Not on file  . Transportation needs:    Medical: Not on file    Non-medical: Not on file  Tobacco Use  . Smoking status: Former Smoker    Last attempt to quit: 04/21/2009    Years since quitting: 9.8  . Smokeless tobacco: Never Used  Substance and Sexual Activity  . Alcohol  use: Yes    Comment: occasional  . Drug use: No  . Sexual activity: Not on file  Lifestyle  . Physical activity:    Days per week: Not on file    Minutes per session: Not on file  . Stress: Not on file  Relationships  . Social connections:    Talks on phone: Not on file    Gets together: Not on file    Attends religious service: Not on file    Active member of club or organization: Not on file    Attends meetings of clubs or organizations: Not on file    Relationship status: Not on file  Other Topics Concern  . Not on file  Social History Narrative  . Not on file   Family History: Mother w/  CV disease and HTN.  ROS: Currently denies lightheadedness, dizziness, Fever, chills, CP, SOB.   No personal history of DVT, PE, MI, or CVA. No loose teeth or dentures. All other systems have been reviewed and were otherwise currently negative with the exception of those mentioned in the HPI and as above.  Objective: Vitals: Ht: 5 feet 10 inches wt: 225 temp: 98.2 BP: 132/84 pulse: 82 O2 96 % on room air.   Physical Exam: General: Alert, NAD.   HEENT: EOMI, Good Neck Extension  Pulm: No increased work of breathing.  Clear B/L A/P w/o crackle or wheeze.  CV: RRR, No m/g/r appreciated  GI: soft, NT, ND Neuro: Neuro without gross focal deficit.  Sensation intact distally Skin: No lesions in the area of chief complaint MSK/Surgical Site: right shoulder pain with range of motion.  Forward flexion/abduction approximately 130.  Internal rotation to back pocket.  External rotation to 5.  Mild AC pain.  No Biceps pain.  Preserved Rotator cuff strength.  NVI distally.  Imaging Review Plain radiographs demonstrate severe degenerative joint disease of both shoulders.    Assessment: OA RIGHT SHOULDER Principal Problem:   Primary osteoarthritis of right shoulder Active Problems:   Hypertension   Primary osteoarthritis of left shoulder   Plan: Plan for Procedure(s): TOTAL SHOULDER ARTHROPLASTY  The patient history, physical exam, clinical judgement of the provider and imaging are consistent with end stage degenerative joint disease and total joint arthroplasty is deemed medically necessary. The treatment options including medical management, injection therapy, and arthroplasty were discussed at length. The risks and benefits of Procedure(s): TOTAL SHOULDER ARTHROPLASTY were presented and reviewed.  The risks of nonoperative treatment, versus surgical intervention including but not limited to continued pain, aseptic loosening, stiffness, dislocation/subluxation, infection, bleeding, nerve  injury, blood clots, cardiopulmonary complications, morbidity, mortality, among others were discussed. The patient verbalizes understanding and wishes to proceed with the plan.  Patient is being admitted for inpatient treatment for surgery, pain control, prophylactic antibiotics, VTE prophylaxis, progressive ambulation, ADL's and discharge planning.   Dental prophylaxis discussed and recommended for 2 years postoperatively.   The patient does meet the criteria for TXA which will be used perioperatively.    DVT prophylaxis: SCDs and early ambulation.  Plan for Tylenol, gabapentin, celebrex, oxycodone for pain.  The patient is planning to be discharged  home in care of his Wife Misty StanleyLisa:  [phone].   Patient's anticipated LOS is less than 2 midnights, meeting these requirements: - Younger than 7365 - Lives within 1 hour of care - Has a competent adult at home to recover with post-op recover - NO history of  - Chronic pain requiring opiods  - Diabetes  -  Coronary Artery Disease  - Heart failure  - Heart attack  - Stroke  - DVT/VTE  - Cardiac arrhythmia  - Respiratory Failure/COPD  - Renal failure  - Anemia  - Advanced Liver disease   Prudencio Burly III, PA-C 02/07/2019 8:25 AM

## 2019-02-11 NOTE — Patient Instructions (Addendum)
Tommy Jones    Your procedure is scheduled on: 02-22-2019  [facility]  Report to admitting at 1015 AM   YOU NEED TO HAVE A COVID 19 TEST ON Friday 02-18-2019 @_1055______ , THIS TEST MUST BE DONE BEFORE SURGERY, [facility].    Call this number if you have problems the morning of surgery [phone]    Remember: . BRUSH YOUR TEETH MORNING OF SURGERY AND RINSE YOUR MOUTH OUT, NO CHEWING GUM CANDY OR MINTS.   NO SOLID FOOD AFTER MIDNIGHT THE NIGHT PRIOR TO SURGERY. NOTHING BY MOUTH EXCEPT CLEAR LIQUIDS UNTIL 430 AM. PLEASE FINISH ENSURE DRINK PER SURGEON ORDER WHICH NEEDS TO BE COMPLETED AT 430 AM.    CLEAR LIQUID DIET   Foods Allowed                                                                     Foods Excluded  Coffee and tea, regular and decaf                             liquids that you cannot  Plain Jell-O in any flavor                                             see through such as: Fruit ices (not with fruit pulp)                                     milk, soups, orange juice  Iced Popsicles                                    All solid food Carbonated beverages, regular and diet                                    Cranberry, grape and apple juices Sports drinks like Gatorade Lightly seasoned clear broth or consume(fat free) Sugar, honey syrup  Sample Menu Breakfast                                Lunch                                     Supper Cranberry juice                    Beef broth                            Chicken broth Jell-O  Grape juice                           Apple juice Coffee or tea                        Jell-O                                      Popsicle                                                Coffee or tea                        Coffee or  tea  _____________________________________________________________________    Take these medicines the morning of surgery with A SIP OF WATER: NONE                               You may not have any metal on your body including hair pins and              piercings  Do not wear jewelry, make-up, lotions, powders or perfumes, deodorant             Do not wear nail polish.  Do not shave  48 hours prior to surgery.              Men may shave face and neck.   Do not bring valuables to the hospital. [place] IS NOT             RESPONSIBLE   FOR VALUABLES.  Contacts, dentures or bridgework may not be worn into surgery.  Leave suitcase in the car. After surgery it may be brought to your room.     _____________________________________________________________________             [facility] Before surgery, you can play an important role.  Because skin is not sterile, your skin needs to be as free of germs as possible.  You can reduce the number of germs on your skin by washing with CHG (chlorahexidine gluconate) soap before surgery.  CHG is an antiseptic cleaner which kills germs and bonds with the skin to continue killing germs even after washing. Please DO NOT use if you have an allergy to CHG or antibacterial soaps.  If your skin becomes reddened/irritated stop using the CHG and inform your nurse when you arrive at Short Stay. Do not shave (including legs and underarms) for at least 48 hours prior to the first CHG shower.  You may shave your face/neck. Please follow these instructions carefully:  1.  Shower with CHG Soap the night before surgery and the  morning of Surgery.  2.  If you choose to wash your hair, wash your hair first as usual with your  normal  shampoo.  3.  After you shampoo, rinse your hair and body thoroughly to remove the  shampoo.                           4.  Use CHG as you  would any other liquid soap.  You can apply chg directly  to the skin and  wash                       Gently with a scrungie or clean washcloth.  5.  Apply the CHG Soap to your body ONLY FROM THE NECK DOWN.   Do not use on face/ open                           Wound or open sores. Avoid contact with eyes, ears mouth and genitals (private parts).                       Wash face,  Genitals (private parts) with your normal soap.             6.  Wash thoroughly, paying special attention to the area where your surgery  will be performed.  7.  Thoroughly rinse your body with warm water from the neck down.  8.  DO NOT shower/wash with your normal soap after using and rinsing off  the CHG Soap.                9.  Pat yourself dry with a clean towel.            10.  Wear clean pajamas.            11.  Place clean sheets on your bed the night of your first shower and do not  sleep with pets. Day of Surgery : Do not apply any lotions/deodorants the morning of surgery.  Please wear clean clothes to the hospital/surgery center.  FAILURE TO FOLLOW THESE INSTRUCTIONS MAY RESULT IN THE CANCELLATION OF YOUR SURGERY PATIENT SIGNATURE_________________________________  NURSE SIGNATURE__________________________________  ________________________________________________________________________   Tommy Jones  An incentive spirometer is a tool that can help keep your lungs clear and active. This tool measures how well you are filling your lungs with each breath. Taking long deep breaths may help reverse or decrease the chance of developing breathing (pulmonary) problems (especially infection) following:  A long period of time when you are unable to move or be active. BEFORE THE PROCEDURE   If the spirometer includes an indicator to show your best effort, your nurse or respiratory therapist will set it to a desired goal.  If possible, sit up straight or lean slightly forward. Try not to slouch.  Hold the incentive spirometer in an upright position. INSTRUCTIONS FOR USE   1. Sit on the edge of your bed if possible, or sit up as far as you can in bed or on a chair. 2. Hold the incentive spirometer in an upright position. 3. Breathe out normally. 4. Place the mouthpiece in your mouth and seal your lips tightly around it. 5. Breathe in slowly and as deeply as possible, raising the piston or the ball toward the top of the column. 6. Hold your breath for 3-5 seconds or for as long as possible. Allow the piston or ball to fall to the bottom of the column. 7. Remove the mouthpiece from your mouth and breathe out normally. 8. Rest for a few seconds and repeat Steps 1 through 7 at least 10 times every 1-2 hours when you are awake. Take your time and take a few normal breaths between deep breaths. 9. The spirometer may include an indicator to show your best effort. Use the  indicator as a goal to work toward during each repetition. 10. After each set of 10 deep breaths, practice coughing to be sure your lungs are clear. If you have an incision (the cut made at the time of surgery), support your incision when coughing by placing a pillow or rolled up towels firmly against it. Once you are able to get out of bed, walk around indoors and cough well. You may stop using the incentive spirometer when instructed by your caregiver.  RISKS AND COMPLICATIONS  Take your time so you do not get dizzy or light-headed.  If you are in pain, you may need to take or ask for pain medication before doing incentive spirometry. It is harder to take a deep breath if you are having pain. AFTER USE  Rest and breathe slowly and easily.  It can be helpful to keep track of a log of your progress. Your caregiver can provide you with a simple table to help with this. If you are using the spirometer at home, follow these instructions: SEEK MEDICAL CARE IF:   You are having difficultly using the spirometer.  You have trouble using the spirometer as often as instructed.  Your pain medication is  not giving enough relief while using the spirometer.  You develop fever of 100.5 F (38.1 C) or higher. SEEK IMMEDIATE MEDICAL CARE IF:   You cough up bloody sputum that had not been present before.  You develop fever of 102 F (38.9 C) or greater.  You develop worsening pain at or near the incision site. MAKE SURE YOU:   Understand these instructions.  Will watch your condition.  Will get help right away if you are not doing well or get worse. Document Released: 12/29/2006 Document Revised: 11/10/2011 Document Reviewed: 03/01/2007 [facility] Patient Information 2014 [place] [place].   ________________________________________________________________________

## 2019-02-14 ENCOUNTER — Encounter (HOSPITAL_COMMUNITY)
Admission: RE | Admit: 2019-02-14 | Discharge: 2019-02-14 | Disposition: A | Discharge status: Home / Self Care | Payer: 59 | Source: Ambulatory Visit | Attending: Orthopedic Surgery | Admitting: Orthopedic Surgery

## 2019-02-14 ENCOUNTER — Encounter (HOSPITAL_COMMUNITY): Payer: Self-pay

## 2019-02-14 ENCOUNTER — Other Ambulatory Visit (HOSPITAL_COMMUNITY): Payer: 59

## 2019-02-14 ENCOUNTER — Other Ambulatory Visit: Payer: Self-pay

## 2019-02-14 DIAGNOSIS — Z01818 Encounter for other preprocedural examination: Secondary | ICD-10-CM | POA: Diagnosis present

## 2019-02-14 DIAGNOSIS — M19011 Primary osteoarthritis, right shoulder: Secondary | ICD-10-CM | POA: Diagnosis not present

## 2019-02-14 LAB — CBC
HCT: 41.4 % (ref 39.0–52.0)
Hemoglobin: 14.8 g/dL (ref 13.0–17.0)
MCH: 34.1 pg — ABNORMAL HIGH (ref 26.0–34.0)
MCHC: 35.7 g/dL (ref 30.0–36.0)
MCV: 95.4 fL (ref 80.0–100.0)
Platelets: 286 10*3/uL (ref 150–400)
RBC: 4.34 MIL/uL (ref 4.22–5.81)
RDW: 11.9 % (ref 11.5–15.5)
WBC: 9.8 10*3/uL (ref 4.0–10.5)
nRBC: 0 % (ref 0.0–0.2)

## 2019-02-14 LAB — BASIC METABOLIC PANEL
Anion gap: 10 (ref 5–15)
BUN: 15 mg/dL (ref 8–23)
CO2: 23 mmol/L (ref 22–32)
Calcium: 9.1 mg/dL (ref 8.9–10.3)
Chloride: 106 mmol/L (ref 98–111)
Creatinine, Ser: 0.64 mg/dL (ref 0.61–1.24)
GFR calc Af Amer: >60 mL/min (ref >60)
GFR calc non Af Amer: >60 mL/min (ref >60)
Glucose, Bld: 99 mg/dL (ref 70–99)
Potassium: 3.8 mmol/L (ref 3.5–5.1)
Sodium: 139 mmol/L (ref 135–145)

## 2019-02-14 LAB — SURGICAL PCR SCREEN
MRSA, PCR: NEGATIVE
Staphylococcus aureus: NEGATIVE

## 2019-02-18 ENCOUNTER — Other Ambulatory Visit: Payer: Self-pay | Admitting: Orthopedic Surgery

## 2019-02-18 ENCOUNTER — Other Ambulatory Visit (HOSPITAL_COMMUNITY)
Admission: RE | Admit: 2019-02-18 | Discharge: 2019-02-18 | Disposition: A | Discharge status: Home / Self Care | Payer: 59 | Source: Ambulatory Visit | Attending: Orthopedic Surgery | Admitting: Orthopedic Surgery

## 2019-02-18 DIAGNOSIS — Z1159 Encounter for screening for other viral diseases: Secondary | ICD-10-CM | POA: Diagnosis not present

## 2019-02-18 LAB — SARS CORONAVIRUS 2 (TAT 6-24 HRS): SARS Coronavirus 2: NEGATIVE

## 2019-02-22 ENCOUNTER — Ambulatory Visit (HOSPITAL_COMMUNITY): Payer: 59 | Admitting: Physician Assistant

## 2019-02-22 ENCOUNTER — Ambulatory Visit (HOSPITAL_COMMUNITY): Payer: 59

## 2019-02-22 ENCOUNTER — Other Ambulatory Visit: Payer: Self-pay

## 2019-02-22 ENCOUNTER — Ambulatory Visit (HOSPITAL_COMMUNITY)
Admission: RE | Admit: 2019-02-22 | Discharge: 2019-02-22 | Disposition: A | Discharge status: Home / Self Care | Payer: 59 | Source: Other Acute Inpatient Hospital | Attending: Orthopedic Surgery | Admitting: Orthopedic Surgery

## 2019-02-22 ENCOUNTER — Ambulatory Visit (HOSPITAL_COMMUNITY): Payer: 59 | Admitting: Anesthesiology

## 2019-02-22 ENCOUNTER — Encounter (HOSPITAL_COMMUNITY)
Admission: RE | Disposition: A | Discharge status: Home / Self Care | Payer: Self-pay | Source: Other Acute Inpatient Hospital | Attending: Orthopedic Surgery

## 2019-02-22 ENCOUNTER — Encounter (HOSPITAL_COMMUNITY): Payer: Self-pay | Admitting: *Deleted

## 2019-02-22 DIAGNOSIS — Z79899 Other long term (current) drug therapy: Secondary | ICD-10-CM | POA: Insufficient documentation

## 2019-02-22 DIAGNOSIS — Z96642 Presence of left artificial hip joint: Secondary | ICD-10-CM | POA: Diagnosis not present

## 2019-02-22 DIAGNOSIS — Z87891 Personal history of nicotine dependence: Secondary | ICD-10-CM | POA: Diagnosis not present

## 2019-02-22 DIAGNOSIS — M19011 Primary osteoarthritis, right shoulder: Secondary | ICD-10-CM | POA: Diagnosis not present

## 2019-02-22 DIAGNOSIS — M19012 Primary osteoarthritis, left shoulder: Secondary | ICD-10-CM | POA: Diagnosis present

## 2019-02-22 DIAGNOSIS — I10 Essential (primary) hypertension: Secondary | ICD-10-CM | POA: Insufficient documentation

## 2019-02-22 DIAGNOSIS — Z791 Long term (current) use of non-steroidal anti-inflammatories (NSAID): Secondary | ICD-10-CM | POA: Diagnosis not present

## 2019-02-22 DIAGNOSIS — Z7982 Long term (current) use of aspirin: Secondary | ICD-10-CM | POA: Insufficient documentation

## 2019-02-22 DIAGNOSIS — Z96611 Presence of right artificial shoulder joint: Secondary | ICD-10-CM

## 2019-02-22 HISTORY — PX: TOTAL SHOULDER ARTHROPLASTY: SHX126

## 2019-02-22 SURGERY — ARTHROPLASTY, SHOULDER, TOTAL
Anesthesia: General | Site: Shoulder | Laterality: Right

## 2019-02-22 MED ORDER — FENTANYL CITRATE (PF) 100 MCG/2ML IJ SOLN
50.0000 ug | INTRAMUSCULAR | Status: DC
  Administered 2019-02-22: 100 ug via INTRAVENOUS
  Filled 2019-02-22: qty 2

## 2019-02-22 MED ORDER — BUPIVACAINE HCL (PF) 0.5 % IJ SOLN
INTRAMUSCULAR | Status: DC | PRN
  Administered 2019-02-22: 15 mL via PERINEURAL

## 2019-02-22 MED ORDER — PROPOFOL 10 MG/ML IV BOLUS
INTRAVENOUS | Status: DC | PRN
  Administered 2019-02-22: 200 mg via INTRAVENOUS

## 2019-02-22 MED ORDER — POVIDONE-IODINE 10 % EX SWAB
2.0000 "application " | Freq: Once | CUTANEOUS | Status: AC
  Administered 2019-02-22: 2 via TOPICAL

## 2019-02-22 MED ORDER — LACTATED RINGERS IV SOLN
INTRAVENOUS | Status: DC
  Administered 2019-02-22 (×2): via INTRAVENOUS

## 2019-02-22 MED ORDER — DEXAMETHASONE SODIUM PHOSPHATE 10 MG/ML IJ SOLN
INTRAMUSCULAR | Status: AC
  Filled 2019-02-22: qty 1

## 2019-02-22 MED ORDER — GABAPENTIN 300 MG PO CAPS: 300.0000 mg | ORAL_CAPSULE | Freq: Two times a day (BID) | ORAL | 0 refills | Status: DC

## 2019-02-22 MED ORDER — ONDANSETRON HCL 4 MG PO TABS: 4.0000 mg | ORAL_TABLET | Freq: Three times a day (TID) | ORAL | 0 refills | Status: DC | PRN

## 2019-02-22 MED ORDER — HYDROMORPHONE HCL 1 MG/ML IJ SOLN
INTRAMUSCULAR | Status: AC
  Filled 2019-02-22: qty 2

## 2019-02-22 MED ORDER — HYDROMORPHONE HCL 1 MG/ML IJ SOLN
0.2500 mg | INTRAMUSCULAR | Status: DC | PRN
  Administered 2019-02-22 (×4): 0.5 mg via INTRAVENOUS

## 2019-02-22 MED ORDER — CELECOXIB 200 MG PO CAPS: 200.0000 mg | ORAL_CAPSULE | Freq: Two times a day (BID) | ORAL | 0 refills | Status: AC

## 2019-02-22 MED ORDER — ONDANSETRON HCL 4 MG/2ML IJ SOLN
INTRAMUSCULAR | Status: AC
  Filled 2019-02-22: qty 2

## 2019-02-22 MED ORDER — ROCURONIUM BROMIDE 10 MG/ML (PF) SYRINGE
PREFILLED_SYRINGE | INTRAVENOUS | Status: DC | PRN
  Administered 2019-02-22: 50 mg via INTRAVENOUS

## 2019-02-22 MED ORDER — CEFAZOLIN SODIUM-DEXTROSE 2-4 GM/100ML-% IV SOLN
2.0000 g | INTRAVENOUS | Status: AC
  Administered 2019-02-22: 2 g via INTRAVENOUS
  Filled 2019-02-22: qty 100

## 2019-02-22 MED ORDER — METHOCARBAMOL 500 MG PO TABS: 500.0000 mg | ORAL_TABLET | Freq: Three times a day (TID) | ORAL | 0 refills | Status: AC | PRN

## 2019-02-22 MED ORDER — PHENYLEPHRINE HCL (PRESSORS) 10 MG/ML IV SOLN
INTRAVENOUS | Status: AC
  Filled 2019-02-22: qty 1

## 2019-02-22 MED ORDER — ACETAMINOPHEN 500 MG PO TABS: 1000.0000 mg | ORAL_TABLET | Freq: Three times a day (TID) | ORAL | 0 refills | Status: AC

## 2019-02-22 MED ORDER — DOCUSATE SODIUM 100 MG PO CAPS: 100.0000 mg | ORAL_CAPSULE | Freq: Two times a day (BID) | ORAL | 0 refills | Status: AC

## 2019-02-22 MED ORDER — CHLORHEXIDINE GLUCONATE 4 % EX LIQD
60.0000 mL | Freq: Once | CUTANEOUS | Status: AC
  Administered 2019-02-22: 4 via TOPICAL

## 2019-02-22 MED ORDER — SODIUM CHLORIDE 0.9 % IV SOLN
INTRAVENOUS | Status: DC | PRN
  Administered 2019-02-22: 50 ug/min via INTRAVENOUS

## 2019-02-22 MED ORDER — SUCCINYLCHOLINE CHLORIDE 200 MG/10ML IV SOSY
PREFILLED_SYRINGE | INTRAVENOUS | Status: AC
  Filled 2019-02-22: qty 10

## 2019-02-22 MED ORDER — KETOROLAC TROMETHAMINE 30 MG/ML IJ SOLN
30.0000 mg | Freq: Once | INTRAMUSCULAR | Status: AC | PRN
  Administered 2019-02-22: 30 mg via INTRAVENOUS

## 2019-02-22 MED ORDER — PROMETHAZINE HCL 25 MG/ML IJ SOLN
6.2500 mg | INTRAMUSCULAR | Status: DC | PRN
  Administered 2019-02-22: 16:00:00 6.25 mg via INTRAVENOUS

## 2019-02-22 MED ORDER — SODIUM CHLORIDE 0.9 % IR SOLN
Status: DC | PRN
  Administered 2019-02-22: 1000 mL

## 2019-02-22 MED ORDER — OXYCODONE HCL 5 MG PO TABS: 5.0000 mg | ORAL_TABLET | Freq: Once | ORAL | Status: DC | PRN

## 2019-02-22 MED ORDER — OXYCODONE HCL 5 MG/5ML PO SOLN: 5.0000 mg | Freq: Once | ORAL | Status: DC | PRN

## 2019-02-22 MED ORDER — LIDOCAINE 2% (20 MG/ML) 5 ML SYRINGE
INTRAMUSCULAR | Status: DC | PRN
  Administered 2019-02-22: 80 mg via INTRAVENOUS

## 2019-02-22 MED ORDER — WATER FOR IRRIGATION, STERILE IR SOLN
Status: DC | PRN
  Administered 2019-02-22: 2000 mL

## 2019-02-22 MED ORDER — CELECOXIB 200 MG PO CAPS
400.0000 mg | ORAL_CAPSULE | Freq: Once | ORAL | Status: AC
  Administered 2019-02-22: 400 mg via ORAL
  Filled 2019-02-22: qty 2

## 2019-02-22 MED ORDER — OXYCODONE HCL 5 MG PO TABS: 5.0000 mg | ORAL_TABLET | ORAL | 0 refills | Status: AC | PRN

## 2019-02-22 MED ORDER — SUGAMMADEX SODIUM 200 MG/2ML IV SOLN
INTRAVENOUS | Status: AC
  Filled 2019-02-22: qty 2

## 2019-02-22 MED ORDER — DEXAMETHASONE SODIUM PHOSPHATE 10 MG/ML IJ SOLN
INTRAMUSCULAR | Status: DC | PRN
  Administered 2019-02-22: 10 mg via INTRAVENOUS

## 2019-02-22 MED ORDER — ACETAMINOPHEN 500 MG PO TABS
1000.0000 mg | ORAL_TABLET | Freq: Once | ORAL | Status: AC
  Administered 2019-02-22: 1000 mg via ORAL
  Filled 2019-02-22: qty 2

## 2019-02-22 MED ORDER — LIDOCAINE 2% (20 MG/ML) 5 ML SYRINGE
INTRAMUSCULAR | Status: AC
  Filled 2019-02-22: qty 5

## 2019-02-22 MED ORDER — OMEPRAZOLE 20 MG PO CPDR: 20.0000 mg | DELAYED_RELEASE_CAPSULE | Freq: Every day | ORAL | 0 refills | Status: AC

## 2019-02-22 MED ORDER — FENTANYL CITRATE (PF) 250 MCG/5ML IJ SOLN
INTRAMUSCULAR | Status: AC
  Filled 2019-02-22: qty 5

## 2019-02-22 MED ORDER — ONDANSETRON HCL 4 MG/2ML IJ SOLN
INTRAMUSCULAR | Status: DC | PRN
  Administered 2019-02-22: 4 mg via INTRAVENOUS

## 2019-02-22 MED ORDER — PROPOFOL 10 MG/ML IV BOLUS
INTRAVENOUS | Status: AC
  Filled 2019-02-22: qty 40

## 2019-02-22 MED ORDER — KETOROLAC TROMETHAMINE 30 MG/ML IJ SOLN
INTRAMUSCULAR | Status: AC
  Filled 2019-02-22: qty 1

## 2019-02-22 MED ORDER — PHENYLEPHRINE 40 MCG/ML (10ML) SYRINGE FOR IV PUSH (FOR BLOOD PRESSURE SUPPORT)
PREFILLED_SYRINGE | INTRAVENOUS | Status: AC
  Filled 2019-02-22: qty 10

## 2019-02-22 MED ORDER — SUCCINYLCHOLINE CHLORIDE 200 MG/10ML IV SOSY
PREFILLED_SYRINGE | INTRAVENOUS | Status: DC | PRN
  Administered 2019-02-22: 120 mg via INTRAVENOUS

## 2019-02-22 MED ORDER — ROCURONIUM BROMIDE 10 MG/ML (PF) SYRINGE
PREFILLED_SYRINGE | INTRAVENOUS | Status: AC
  Filled 2019-02-22: qty 10

## 2019-02-22 MED ORDER — PHENYLEPHRINE 40 MCG/ML (10ML) SYRINGE FOR IV PUSH (FOR BLOOD PRESSURE SUPPORT)
PREFILLED_SYRINGE | INTRAVENOUS | Status: DC | PRN
  Administered 2019-02-22 (×5): 80 ug via INTRAVENOUS

## 2019-02-22 MED ORDER — PROMETHAZINE HCL 25 MG/ML IJ SOLN
INTRAMUSCULAR | Status: AC
  Filled 2019-02-22: qty 1

## 2019-02-22 MED ORDER — FENTANYL CITRATE (PF) 250 MCG/5ML IJ SOLN
INTRAMUSCULAR | Status: DC | PRN
  Administered 2019-02-22 (×5): 50 ug via INTRAVENOUS

## 2019-02-22 MED ORDER — SUGAMMADEX SODIUM 200 MG/2ML IV SOLN
INTRAVENOUS | Status: DC | PRN
  Administered 2019-02-22: 200 mg via INTRAVENOUS

## 2019-02-22 MED ORDER — TRANEXAMIC ACID-NACL 1000-0.7 MG/100ML-% IV SOLN
1000.0000 mg | INTRAVENOUS | Status: AC
  Administered 2019-02-22: 1000 mg via INTRAVENOUS
  Filled 2019-02-22: qty 100

## 2019-02-22 MED ORDER — FENTANYL CITRATE (PF) 100 MCG/2ML IJ SOLN
25.0000 ug | INTRAMUSCULAR | Status: DC | PRN
  Administered 2019-02-22 (×3): 50 ug via INTRAVENOUS

## 2019-02-22 MED ORDER — FENTANYL CITRATE (PF) 100 MCG/2ML IJ SOLN
INTRAMUSCULAR | Status: AC
  Filled 2019-02-22: qty 4

## 2019-02-22 MED ORDER — BUPIVACAINE LIPOSOME 1.3 % IJ SUSP
INTRAMUSCULAR | Status: DC | PRN
  Administered 2019-02-22: 10 mL via PERINEURAL

## 2019-02-22 MED ORDER — MIDAZOLAM HCL 2 MG/2ML IJ SOLN
1.0000 mg | INTRAMUSCULAR | Status: DC
  Administered 2019-02-22: 2 mg via INTRAVENOUS
  Filled 2019-02-22: qty 2

## 2019-02-22 SURGICAL SUPPLY — 54 items
ADPR HD STD TPR HUM TI RVRS (Orthopedic Implant) ×1 IMPLANT
BANDAGE ACE 4X5 VEL STRL LF (GAUZE/BANDAGES/DRESSINGS) ×2 IMPLANT
BLADE SAW SAG 73X25 THK (BLADE) ×1
BLADE SAW SGTL 73X25 THK (BLADE) ×1 IMPLANT
BLADE SURG SZ10 CARB STEEL (BLADE) ×4 IMPLANT
CEMENT BONE R 1X40 (Cement) ×1 IMPLANT
CHLORAPREP W/TINT 26 (MISCELLANEOUS) ×2 IMPLANT
CLSR STERI-STRIP ANTIMIC 1/2X4 (GAUZE/BANDAGES/DRESSINGS) ×2 IMPLANT
COVER SURGICAL LIGHT HANDLE (MISCELLANEOUS) ×2 IMPLANT
COVER WAND RF STERILE (DRAPES) IMPLANT
DRAPE INCISE IOBAN 66X45 STRL (DRAPES) ×2 IMPLANT
DRAPE U-SHAPE 47X51 STRL (DRAPES) ×2 IMPLANT
DRSG ADAPTIC 3X8 NADH LF (GAUZE/BANDAGES/DRESSINGS) ×2 IMPLANT
DRSG MEPILEX BORDER 4X8 (GAUZE/BANDAGES/DRESSINGS) ×1 IMPLANT
ELECT REM PT RETURN 15FT ADLT (MISCELLANEOUS) ×2 IMPLANT
GAUZE SPONGE 4X4 12PLY STRL (GAUZE/BANDAGES/DRESSINGS) ×2 IMPLANT
GLENOID PEGGED STRL MED 4MM (Orthopedic Implant) ×1 IMPLANT
GLENOID POST REGENREX HYBRID (Orthopedic Implant) ×1 IMPLANT
GLOVE BIO SURGEON STRL SZ7.5 (GLOVE) ×4 IMPLANT
GLOVE BIOGEL PI IND STRL 8 (GLOVE) ×2 IMPLANT
GLOVE BIOGEL PI INDICATOR 8 (GLOVE) ×2
GOWN STRL REUS W/ TWL LRG LVL3 (GOWN DISPOSABLE) ×2 IMPLANT
GOWN STRL REUS W/TWL LRG LVL3 (GOWN DISPOSABLE) ×2
HEAD HUMERAL COMP STD (Orthopedic Implant) IMPLANT
HEAD MODULAR 50MMX21MMX57MM (Orthopedic Implant) IMPLANT
HUMERAL HEAD COMP STD (Orthopedic Implant) ×2 IMPLANT
KIT BASIN OR (CUSTOM PROCEDURE TRAY) ×2 IMPLANT
KIT TURNOVER KIT A (KITS) IMPLANT
MANIFOLD NEPTUNE II (INSTRUMENTS) ×2 IMPLANT
MODULAR HEAD 50MMX21MMX57MM (Orthopedic Implant) ×2 IMPLANT
NS IRRIG 1000ML POUR BTL (IV SOLUTION) ×2 IMPLANT
PACK SHOULDER (CUSTOM PROCEDURE TRAY) ×2 IMPLANT
PIN HUMERAL STMN 3.2MMX9IN (INSTRUMENTS) ×2 IMPLANT
RESTRAINT HEAD UNIVERSAL NS (MISCELLANEOUS) ×2 IMPLANT
SLING ARM IMMOBILIZER LRG (SOFTGOODS) ×2 IMPLANT
SLING ARM IMMOBILIZER MED (SOFTGOODS) IMPLANT
SLING ARM IMMOBILIZER XL (CAST SUPPLIES) ×1 IMPLANT
SMARTMIX MINI TOWER (MISCELLANEOUS) ×2
SPONGE LAP 18X18 X RAY DECT (DISPOSABLE) ×2 IMPLANT
STEM HUMERAL STRL 11MMX55MM (Stem) ×1 IMPLANT
SUCTION FRAZIER HANDLE 10FR (MISCELLANEOUS) ×1
SUCTION TUBE FRAZIER 10FR DISP (MISCELLANEOUS) ×1 IMPLANT
SUPPORT WRAP ARM LG (MISCELLANEOUS) IMPLANT
SUT FIBERWIRE #2 38 T-5 BLUE (SUTURE) ×6
SUT MNCRL AB 4-0 PS2 18 (SUTURE) ×2 IMPLANT
SUT VIC AB 0 CT1 27 (SUTURE) ×1
SUT VIC AB 0 CT1 27XBRD ANBCTR (SUTURE) ×1 IMPLANT
SUT VIC AB 2-0 CT1 27 (SUTURE) ×1
SUT VIC AB 2-0 CT1 TAPERPNT 27 (SUTURE) ×1 IMPLANT
SUTURE FIBERWR #2 38 T-5 BLUE (SUTURE) ×2 IMPLANT
TOWEL OR 17X26 10 PK STRL BLUE (TOWEL DISPOSABLE) ×4 IMPLANT
TOWER SMARTMIX MINI (MISCELLANEOUS) ×1 IMPLANT
TRAY FOLEY MTR SLVR 14FR STAT (SET/KITS/TRAYS/PACK) IMPLANT
WATER STERILE IRR 1000ML POUR (IV SOLUTION) ×2 IMPLANT

## 2019-02-22 NOTE — Anesthesia Procedure Notes (Signed)
Procedure Name: Intubation Date/Time: 02/22/2019 12:48 PM Performed by: Talbot Grumbling, CRNA Pre-anesthesia Checklist: Patient identified, Emergency Drugs available, Suction available and Patient being monitored Patient Re-evaluated:Patient Re-evaluated prior to induction Oxygen Delivery Method: Circle system utilized Preoxygenation: Pre-oxygenation with 100% oxygen Induction Type: IV induction Ventilation: Mask ventilation without difficulty Laryngoscope Size: Mac and 3 Grade View: Grade II Tube type: Oral Tube size: 7.5 mm Number of attempts: 1 Airway Equipment and Method: Stylet Placement Confirmation: ETT inserted through vocal cords under direct vision,  positive ETCO2 and breath sounds checked- equal and bilateral Secured at: 23 cm Tube secured with: Tape Dental Injury: Teeth and Oropharynx as per pre-operative assessment

## 2019-02-22 NOTE — Anesthesia Postprocedure Evaluation (Signed)
Anesthesia Post Note  Patient: Tommy Jones  Procedure(s) Performed: TOTAL SHOULDER ARTHROPLASTY (Right Shoulder)     Patient location during evaluation: PACU Anesthesia Type: General Level of consciousness: awake and alert Pain management: pain level controlled Vital Signs Assessment: post-procedure vital signs reviewed and stable Respiratory status: spontaneous breathing, nonlabored ventilation and respiratory function stable Cardiovascular status: blood pressure returned to baseline and stable Postop Assessment: no apparent nausea or vomiting Anesthetic complications: no    Last Vitals:  Vitals:   02/22/19 1615 02/22/19 1630  BP:  117/73  Pulse: 72 75  Resp: 17 18  Temp:  36.6 C  SpO2: (!) 88% 98%    Last Pain:  Vitals:   02/22/19 1630  TempSrc:   PainSc: [place]

## 2019-02-22 NOTE — Interval H&P Note (Signed)
I participated in the care of this patient and agree with the above history, physical and evaluation. I performed a review of the history and a physical exam as detailed   Aliyana Dlugosz Daniel Burnett Spray MD  

## 2019-02-22 NOTE — Anesthesia Preprocedure Evaluation (Signed)
Anesthesia Evaluation  Patient identified by MRN, date of birth, ID band Patient awake    Reviewed: Allergy & Precautions, NPO status , Patient's Chart, lab work & pertinent test results  Airway Mallampati: II  TM Distance: >3 FB Neck ROM: Full    Dental no notable dental hx.    Pulmonary neg pulmonary ROS, former smoker,    Pulmonary exam normal breath sounds clear to auscultation       Cardiovascular hypertension, Normal cardiovascular exam Rhythm:Regular Rate:Normal     Neuro/Psych negative neurological ROS  negative psych ROS   GI/Hepatic negative GI ROS, Neg liver ROS,   Endo/Other  negative endocrine ROS  Renal/GU negative Renal ROS  negative genitourinary   Musculoskeletal negative musculoskeletal ROS (+)   Abdominal   Peds negative pediatric ROS (+)  Hematology negative hematology ROS (+)   Anesthesia Other Findings   Reproductive/Obstetrics negative OB ROS                             Anesthesia Physical Anesthesia Plan  ASA: II  Anesthesia Plan: General   Post-op Pain Management:  Regional for Post-op pain   Induction: Intravenous  PONV Risk Score and Plan: 2 and Ondansetron, Dexamethasone and Treatment may vary due to age or medical condition  Airway Management Planned: Oral ETT  Additional Equipment:   Intra-op Plan:   Post-operative Plan: Extubation in OR  Informed Consent: I have reviewed the patients History and Physical, chart, labs and discussed the procedure including the risks, benefits and alternatives for the proposed anesthesia with the patient or authorized representative who has indicated his/her understanding and acceptance.     Dental advisory given  Plan Discussed with: CRNA and Surgeon  Anesthesia Plan Comments:         Anesthesia Quick Evaluation

## 2019-02-22 NOTE — Anesthesia Procedure Notes (Signed)
Anesthesia Procedure Image    

## 2019-02-22 NOTE — Op Note (Signed)
02/22/2019  2:44 PM  PATIENT:  Tommy Jones    PRE-OPERATIVE DIAGNOSIS:  OA RIGHT SHOULDER  POST-OPERATIVE DIAGNOSIS:  Same  PROCEDURE:  TOTAL SHOULDER ARTHROPLASTY  SURGEON:  Renette Butters, MD  PHYSICIAN ASSISTANT: Roxan Hockey, PA-C, he was present and scrubbed throughout the case, critical for completion in a timely fashion, and for retraction, instrumentation, and closure.   ANESTHESIA:   General  PREOPERATIVE INDICATIONS:  MIKAL BLASDELL is a  63 y.o. male with a diagnosis of OA RIGHT SHOULDER who failed conservative measures and elected for surgical management.    The risks benefits and alternatives were discussed with the patient preoperatively including but not limited to the risks of infection, bleeding, nerve injury, cardiopulmonary complications, the need for revision surgery, dislocation, loosening, incomplete relief of pain, among others, and the patient was willing to proceed.   OPERATIVE IMPLANTS: Biomet size 11 press-fit humeral stem, size 50  humeral head, set in the 21 position with increased coverage, with a medium cemented glenoid polyethylene 3 peg implant with central pressfit peg.   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   OPERATIVE PROCEDURE: The patient was brought to the operating room and placed in the supine position. General anesthesia was administered. IV antibiotics were given.  The upper extremity was prepped and draped in usual sterile fashion. The patient was in a beachchair position with all bony prominences padded.   Time out was performed and a deltopectoral approach was carried out. The biceps tendon was tenodesed to the pectoralis tendon. The subscapularis was released, tagging it with a #2 MaxBraid, leaving a cuff of tendon for repair.   The inferior osteophyte was removed, and release of the capsule off of the humeral side was  completed. The head was dislocated, and I reamed sequentially. I placed the humeral cutting guide at 30 of retroversion, and then pinned this into place, and made my humeral neck cut. This was at the appropriate level.   I then placed deep retractors and exposed the glenoid. I excised the labrum circumferentially, taking care to protect the axillary nerve inferiorly.   I then placed a guidewire into the center position, controlling appropriate version and inclination. I then reamed over the guidewire with the small reamer, and was satisfied with the preparation. I preserved the subchondral bone in order to maximize the strength and minimize the risk for subsequent subsidence.   I then drilled the central hole for the regenerex peg, and then placed the guide, and then drilled the 3 peripheral peg holes. I had excellent bony circumferential contact.   I then cleaned the glenoid, irrigated it copiously, and then dried it and cemented the prosthesis into place. Excellent seating was achieved. I had full exposure. The cement cured, and then I turned my attention to the humeral side.   I sequentially broached, up to the selected size, with the broach set at 30 of retroversion. I then placed the real stem. I trialed with multiple heads, and the above-named component was selected. Increased posterior coverage improved the coverage. The soft tissue tension was appropriate.   I then impacted the real humeral head into place, reduced the head, and irrigated copiously. Excellent stability and range of motion was achieved. I repaired the subscapularis with 4 #2 fiberwire, as well as the rotator interval, and irrigated copiously once more. The subcutaneous tissue was closed with Vicryl including the deltopectoral fascia.   The skin was  closed with Steri-Strips and sterile gauze was applied. He had a preoperative nerve block. He tolerated the procedure well and there were no complications.

## 2019-02-22 NOTE — Discharge Instructions (Signed)
Keep sling on at all times.  Do not bear weight with arm. °Keep your dressing on and dry until follow up. °Take pain medicine as needed with the goal of transitioning to over the counter medicines.  °If needed, you may increase breakthrough pain medication (oxycodone) for the first few days post op - up to 2 tablets every 4 hours.  Stop as this medication as soon as you are able. ° °INSTRUCTIONS AFTER JOINT REPLACEMENT  ° °o Remove items at home which could result in a fall. This includes throw rugs or furniture in walking pathways °o ICE to the affected joint every three hours while awake for 30 minutes at a time, for at least the first 3-5 days, and then as needed for pain and swelling.  Continue to use ice for pain and swelling. You may notice swelling that will progress down to the foot and ankle.  This is normal after surgery.  Elevate your leg when you are not up walking on it.   °o Continue to use the breathing machine you got in the hospital (incentive spirometer) which will help keep your temperature down.  It is common for your temperature to cycle up and down following surgery, especially at night when you are not up moving around and exerting yourself.  The breathing machine keeps your lungs expanded and your temperature down. ° ° °DIET:  As you were doing prior to hospitalization, we recommend a well-balanced diet. ° °DRESSING / WOUND CARE / SHOWERING ° °Keep dressing dry.  You may use an occlusive plastic wrap (Press'n Seal for example) with blue painter's tape at edges, NO SOAKING/SUBMERGING IN THE BATHTUB.  If the bandage gets wet, change with a clean dry gauze.  If the incision gets wet, pat the wound dry with a clean towel. ° °ACTIVITY ° °o Increase activity slowly as tolerated, but follow the weight bearing instructions below.   °o No driving for 6 weeks or until further direction given by your physician.  You cannot drive while taking narcotics.  °o No lifting or carrying greater than 10 lbs.  until further directed by your surgeon. °o Avoid periods of inactivity such as sitting longer than an hour when not asleep. This helps prevent blood clots.  °o You may return to work once you are authorized by your doctor.  ° ° ° °WEIGHT BEARING  °Do not bear weight with arm.  Maintain sling at all times. ° °A rehabilitation program following joint replacement surgery can speed recovery and prevent re-injury in the future due to weakened muscles. Contact your doctor or a physical therapist for more information on knee rehabilitation.  ° ° °CONSTIPATION ° °Constipation is defined medically as fewer than three stools per week and severe constipation as less than one stool per week.  Even if you have a regular bowel pattern at home, your normal regimen is likely to be disrupted due to multiple reasons following surgery.  Combination of anesthesia, postoperative narcotics, change in appetite and fluid intake all can affect your bowels.  ° °YOU MUST use at least one of the following options; they are listed in order of increasing strength to get the job done.  They are all available over the counter, and you may need to use some, POSSIBLY even all of these options:   ° °Drink plenty of fluids (prune juice may be helpful) and high fiber foods °Colace 100 mg by mouth twice a day  °Senokot for constipation as directed and as needed   Dulcolax (bisacodyl), take with full glass of water  °Miralax (polyethylene glycol) once or twice a day as needed. ° °If you have tried all these things and are unable to have a bowel movement in the first 3-4 days after surgery call either your surgeon or your primary doctor.   ° °If you experience loose stools or diarrhea, hold the medications until you stool forms back up.  If your symptoms do not get better within 1 week or if they get worse, check with your doctor.  If you experience "the worst abdominal pain ever" or develop nausea or vomiting, please contact the office immediately for  further recommendations for treatment. ° ° °ITCHING:  If you experience itching with your medications, try taking only a single pain pill, or even half a pain pill at a time.  You can also use Benadryl over the counter for itching or also to help with sleep.  ° °TED HOSE STOCKINGS:  Use stockings on both legs until for at least 2 weeks or as directed by physician office. They may be removed at night for sleeping. ° °MEDICATIONS:  See your medication summary on the “After Visit Summary” that nursing will review with you.  You may have some home medications which will be placed on hold until you complete the course of blood thinner medication.  It is important for you to complete the blood thinner medication as prescribed. ° °PRECAUTIONS:  If you experience chest pain or shortness of breath - call 911 immediately for transfer to the hospital emergency department.  ° °If you develop a fever greater that 101 F, purulent drainage from wound, increased redness or drainage from wound, foul odor from the wound/dressing, or calf pain - CONTACT YOUR SURGEON.   °                                                °FOLLOW-UP APPOINTMENTS:  If you do not already have a post-op appointment, please call the office for an appointment to be seen by your surgeon.  Guidelines for how soon to be seen are listed in your “After Visit Summary”, but are typically between 1-4 weeks after surgery. ° °OTHER INSTRUCTIONS:  ° ° ° °MAKE SURE YOU:  °• Understand these instructions.  °• Get help right away if you are not doing well or get worse.  ° ° °Thank you for letting us be a part of your medical care team.  It is a privilege we respect greatly.  We hope these instructions will help you stay on track for a fast and full recovery!  ° ° ° ° ° °

## 2019-02-22 NOTE — Anesthesia Procedure Notes (Signed)
Anesthesia Regional Block: Interscalene brachial plexus block   Pre-Anesthetic Checklist: ,, timeout performed, Correct Patient, Correct Site, Correct Laterality, Correct Procedure, Correct Position, site marked, Risks and benefits discussed,  Surgical consent,  Pre-op evaluation,  At surgeon's request and post-op pain management  Laterality: Right  Prep: chloraprep       Needles:  Injection technique: Single-shot  Needle Type: Echogenic Needle     Needle Length: 9cm      Additional Needles:   Procedures:,,,, ultrasound used (permanent image in chart),,,,  Narrative:  Start time: 02/22/2019 11:37 AM End time: 02/22/2019 11:46 AM Injection made incrementally with aspirations every 5 mL.  Performed by: Personally  Anesthesiologist: Myrtie Soman, MD  Additional Notes: Patient tolerated the procedure well without complications

## 2019-02-22 NOTE — Transfer of Care (Signed)
Immediate Anesthesia Transfer of Care Note  Patient: Tommy Jones  Procedure(s) Performed: TOTAL SHOULDER ARTHROPLASTY (Right Shoulder)  Patient Location: PACU  Anesthesia Type:General  Level of Consciousness: awake, alert  and oriented  Airway & Oxygen Therapy: Patient Spontanous Breathing and Patient connected to face mask oxygen  Post-op Assessment: Report given to RN and Post -op Vital signs reviewed and stable  Post vital signs: Reviewed and stable  Last Vitals:  Vitals Value Taken Time  BP    Temp    Pulse 78 02/22/19 1517  Resp    SpO2 98 % 02/22/19 1517  Vitals shown include unvalidated device data.  Last Pain:  Vitals:   02/22/19 1150  TempSrc:   PainSc: 0-No pain         Complications: No apparent anesthesia complications

## 2019-02-22 NOTE — Progress Notes (Signed)
Assisted Dr. Rose with right, ultrasound guided, interscalene  block. Side rails up, monitors on throughout procedure. See vital signs in flow sheet. Tolerated Procedure well. 

## 2019-02-23 ENCOUNTER — Encounter (HOSPITAL_COMMUNITY): Payer: Self-pay | Admitting: Orthopedic Surgery

## 2019-07-13 ENCOUNTER — Encounter: Payer: Self-pay | Admitting: Neurology

## 2019-07-13 ENCOUNTER — Ambulatory Visit (INDEPENDENT_AMBULATORY_CARE_PROVIDER_SITE_OTHER): Payer: 59 | Admitting: Neurology

## 2019-07-13 ENCOUNTER — Other Ambulatory Visit: Payer: Self-pay

## 2019-07-13 VITALS — BP 142/89 | HR 91 | Ht 72.0 in | Wt 223.0 lb

## 2019-07-13 DIAGNOSIS — R351 Nocturia: Secondary | ICD-10-CM | POA: Diagnosis not present

## 2019-07-13 DIAGNOSIS — H532 Diplopia: Secondary | ICD-10-CM | POA: Diagnosis not present

## 2019-07-13 DIAGNOSIS — R0683 Snoring: Secondary | ICD-10-CM | POA: Diagnosis not present

## 2019-07-13 DIAGNOSIS — R519 Headache, unspecified: Secondary | ICD-10-CM

## 2019-07-13 DIAGNOSIS — G4719 Other hypersomnia: Secondary | ICD-10-CM | POA: Diagnosis not present

## 2019-07-13 NOTE — Progress Notes (Signed)
Subjective:    Patient ID: Tommy Jones is a 63 y.o. male.  HPI     Huston Foley, MD, PhD [facility] [address] [address] [place], [place] [address]  Dear Dr. Kevan Ny,   I saw your patient, Tommy Jones, upon your kind request to my neurologic clinic today for initial consultation of his dizziness, associated with double vision.  The patient is unaccompanied today.  As you know, 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  Denies actual dizziness or lightheadedness.  He denies vertiginous symptoms. I reviewed your office note from 07/06/2019.  He has had lightheadedness.  He has had episodic double vision that last for seconds. His blood pressure sitting was 118/80 in your office.  He had blood work through your office including CBC with differential, CMP, B12, TSH and lipid panel.  He also had a PSA and vitamin D check.  We requested blood test results from your office and I was able to review them: CBC with differential was unremarkable, CMP unremarkable, BUN 16, creatinine 0.78, PSA normal, B12 normal at 494, TSH normal at 2.74, vitamin D normal on the lower end at 36.8.  Lipid panel showed total cholesterol of 188, triglycerides elevated at 244, LDL 105, borderline.  He denies any migrainous headaches are severe headaches, denies any one-sided weakness or numbness or tingling or droopy face or slurring of speech.  He denies constant diplopia.  He feels that it happens when he gets tired and his eyelids get heavy.  It can last for a few seconds only.  No additional neurological accompaniments, he has not fallen.  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  He tries to hydrate well with water, drinks caffeine and limitation, 1 cup of coffee per day and very occasional alcohol, he quit smoking some 10 years ago.  He had an eye examination with his optometrist as I understand some 6 months ago which was a routine checkup and it was fine.  He had shoulder replacement subsequently and did well.  He feels that his sleep improved after his shoulder surgery because his pain is gone.  He does snore.  His Epworth sleepiness score is elevated at 15 out of 24, fatigue severity score is 51 out of 63.  He has had some nocturnal headaches and occasionally takes aspirin at night.  He has nocturia about 3 times per average night.  He lives with his wife.  He has 1 grown son who works for [employer]   He has occasional hand tremors.  He does report that his mother who is 66 years old has hand tremors.  His Past Medical History Is Significant For: Past Medical History:  Diagnosis Date  . Arthritis   . Hypertension   . Pneumonia     His Past Surgical History Is Significant For: Past Surgical History:  Procedure Laterality Date  . COLONOSCOPY    . SHOULDER SURGERY Right    arthroscopic surgery   . TOTAL HIP ARTHROPLASTY Left 05/02/2014   Procedure: TOTAL HIP ARTHROPLASTY ANTERIOR APPROACH;  Surgeon: Sheral Apley, MD;  Location: [facility] OR;  Service: Orthopedics;  Laterality: Left;  . TOTAL SHOULDER ARTHROPLASTY Right 02/22/2019  Procedure: TOTAL SHOULDER ARTHROPLASTY;  Surgeon: Renette Butters, MD;  Location: [facility] OR[facility];  Service: Orthopedics;  Laterality: Right;    His Family History Is Significant For: No family history on file.  His Social History Is Significant For: Social History   Socioeconomic History  . Marital status: Married    Spouse name: Not on file  . Number of children: Not on file  . Years of education: Not on file  . Highest education level: Not on file  Occupational History  . Not on file  Social Needs  . Financial  resource strain: Not on file  . Food insecurity    Worry: Not on file    Inability: Not on file  . Transportation needs    Medical: Not on file    Non-medical: Not on file  Tobacco Use  . Smoking status: Former Smoker    Quit date: 04/21/2009    Years since quitting: 10.2  . Smokeless tobacco: Never Used  Substance and Sexual Activity  . Alcohol use: Yes    Comment: occasional  . Drug use: No  . Sexual activity: Not on file  Lifestyle  . Physical activity    Days per week: Not on file    Minutes per session: Not on file  . Stress: Not on file  Relationships  . Social Herbalist on phone: Not on file    Gets together: Not on file    Attends religious service: Not on file    Active member of club or organization: Not on file    Attends meetings of clubs or organizations: Not on file    Relationship status: Not on file  Other Topics Concern  . Not on file  Social History Narrative  . Not on file    His Allergies Are:  No Known Allergies:   His Current Medications Are:  Outpatient Encounter Medications as of 07/13/2019  Medication Sig  . B Complex-C (B-COMPLEX WITH VITAMIN C) tablet Take 1 tablet by mouth daily.  . cholecalciferol (VITAMIN D3) 25 MCG (1000 UT) tablet Take 1,000 Units by mouth daily.  Marland Kitchen docusate sodium (COLACE) 100 MG capsule Take 1 capsule (100 mg total) by mouth 2 (two) times daily. To prevent constipation while taking pain medication.  . hydrochlorothiazide (HYDRODIURIL) 12.5 MG tablet Take 12.5 mg by mouth daily.  Marland Kitchen losartan (COZAAR) 100 MG tablet Take 100 mg by mouth daily.  . Magnesium 250 MG TABS Take 250 mg by mouth daily.  . methocarbamol (ROBAXIN) 500 MG tablet Take 1 tablet (500 mg total) by mouth every 8 (eight) hours as needed for muscle spasms.  . Misc Natural Products (JOINT SUPPORT COMPLEX PO) Take 1 tablet by mouth daily.  . Multiple Vitamins-Minerals (MULTIVITAMIN WITH MINERALS) tablet Take 1 tablet by mouth daily.  Marland Kitchen  omeprazole (PRILOSEC) 20 MG capsule Take 1 capsule (20 mg total) by mouth daily. 30 days for gastroprotection while taking NSAIDs.  Marland Kitchen vitamin C (ASCORBIC ACID) 500 MG tablet Take 500 mg by mouth daily.  . [DISCONTINUED] ondansetron (ZOFRAN) 4 MG tablet Take 1 tablet (4 mg total) by mouth every 8 (eight) hours as needed for nausea or vomiting.  . [DISCONTINUED] gabapentin (NEURONTIN) 300 MG capsule Take 1 capsule (300 mg total) by mouth 2 (two) times daily for 14 days. For 2 weeks post op for pain.   No facility-administered encounter medications on file as of 07/13/2019.   :   Review of Systems:  Out of a  complete 14 point review of systems, all are reviewed and negative with the exception of these symptoms as listed below:  Review of Systems  Neurological:       Pt presents today to discuss his occasional double vision. He denies dizziness with his double vision. If he closes one eye his double vision is better. Pt does endorse snoring. Pt has never had a sleep study.  Epworth Sleepiness Scale 0= would never doze 1= slight chance of dozing 2= moderate chance of dozing 3= high chance of dozing  Sitting and reading: 2 Watching TV: 3 Sitting inactive in a public place (ex. Theater or meeting): 3 As a passenger in a car for an hour without a break: 2 Lying down to rest in the afternoon: 2 Sitting and talking to someone: 1 Sitting quietly after lunch (no alcohol): 1 In a car, while stopped in traffic: 1 Total: 15     Objective:  Neurological Exam  Physical Exam Physical Examination:   Vitals:   07/13/19 1122  BP: (!) 142/89  Pulse: 91    General Examination: The patient is a very pleasant 63 y.o. male in no acute distress. He appears well-developed and well-nourished and well groomed. He denies any diplopia, orthostatic lightheadedness or Vertiginous symptoms.  HEENT: Normocephalic, atraumatic, pupils are equal, round and reactive to light and accommodation. Funduscopic  exam is normal with sharp disc margins noted. Extraocular tracking is good without limitation to gaze excursion or nystagmus noted. Normal smooth pursuit is noted. Hearing is grossly intact. Tympanic membranes are clear bilaterally. Face is symmetric with normal facial animation and normal facial sensation. Speech is clear with no dysarthria noted. There is no hypophonia. There is no lip, neck/head, jaw or voice tremor. Neck is supple with full range of passive and active motion. There are no carotid bruits on auscultation. Oropharynx exam reveals: moderate mouth dryness, adequate dental hygiene and moderate airway crowding, due to Redundant soft palate with small airway entry.  Tonsils are not fully visualized, Mallampati is class III.  Neck circumference is 18 inches.  Tongue protrudes centrally in palate elevates symmetrically. Thicker tongue noted.   Chest: Clear to auscultation without wheezing, rhonchi or crackles noted.  Heart: S1+S2+0, regular and normal without murmurs, rubs or gallops noted.   Abdomen: Soft, non-tender and non-distended with normal bowel sounds appreciated on auscultation.  Extremities: There is no pitting edema in the distal lower extremities bilaterally. Pedal pulses are intact.  Skin: Warm and dry without trophic changes noted.  Musculoskeletal: exam reveals no obvious joint deformities, tenderness or joint swelling or erythema.   Neurologically:  Mental status: The patient is awake, alert and oriented in all 4 spheres. His immediate and remote memory, attention, language skills and fund of knowledge are appropriate. There is no evidence of aphasia, agnosia, apraxia or anomia. Speech is clear with normal prosody and enunciation. Thought process is linear. Mood is normal and affect is normal.  Cranial nerves II - XII are as described above under HEENT exam. In addition: shoulder shrug is normal with equal shoulder height noted. Motor exam: Normal bulk, strength and tone  is noted. There is no drift, tremor or rebound. Romberg is negative. Reflexes are 2+ throughout. Babinski: Toes are flexor bilaterally. Fine motor skills and coordination: intact with normal finger taps, normal hand movements, normal rapid alternating patting, normal foot taps and normal foot agility.  Cerebellar testing: No dysmetria or intention tremor on finger to nose testing. Heel to shin is unremarkable bilaterally. There  is no truncal or gait ataxia.  Sensory exam: intact to light touch, vibration, temperature sense in the upper and lower extremities.  Gait, station and balance: He stands easily. No veering to one side is noted. No leaning to one side is noted. Posture is age-appropriate and stance is narrow based. Gait shows normal stride length and normal pace. No problems turning are noted. Tandem walk is unremarkable.   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  He is currently without symptoms and neurological exam is nonfocal.  He denies any dizziness, lightheadedness, orthostatic symptoms, vertiginous symptoms.  He denies any one-sided symptoms.He is largely reassured today.  Nevertheless, given his recurrent nature of eye related/Vision related problems I would recommend he seek consultation with an ophthalmologist as well.  We will proceed with a brain MRI with and without contrast to rule out a structural cause of his symptoms.  In addition, he endorses sleep related symptoms and daytime somnolence.  He may be at risk for underlying obstructive sleep apnea.  He is encouraged to pursue a sleep study.  I will order a brain MRI and sleep study and we will call him soon  to schedule these.  We will keep him posted as to his results by phone call and follow-up afterwards if needed. We talked about the importance of pursuing healthy lifestyle, enough sleep, and stress reduction. I explained the sleep test procedure to the patient and also outlined possible surgical and non-surgical treatment options of OSA.  I answered all his questions today and the patient was in agreement.  Thank you very much for allowing me to participate in the care of this nice patient. If I can be of any further assistance to you please do not hesitate to call me at [phone].  Sincerely,   Huston Foley, MD, PhD

## 2019-07-13 NOTE — Patient Instructions (Signed)
Your neurological exam is normal which is of course reassuring.  Nevertheless, I would like to do a couple of things, I would like to pursue a sleep study to rule out obstructive sleep apnea as you may be at risk for sleep apnea. In addition, I would like to do a brain MRI with and without contrast to rule out a structural cause of your intermittent double vision. I would like for you to get a formal eye examination done with an ophthalmologist which is an eye doctor (physician).  We will keep you posted as to your scan results and sleep study results.  If you have obstructive sleep apnea I would recommend that you get treated with a CPAP or AutoPap machine.

## 2019-07-20 ENCOUNTER — Telehealth: Payer: Self-pay

## 2019-07-20 NOTE — Telephone Encounter (Signed)
Noted, thanks!

## 2019-07-20 NOTE — Telephone Encounter (Signed)
Called pt today to schedule sleep study. Insurance does not require authorization for either in lab or HST. Pt states he is not convinced that he needs to have a sleep study. Pt then mentions that he doesn't want to proceed until he has an eye exam. Pt was given my direct number to call me back when ready to schedule.

## 2020-10-07 IMAGING — DX PORTABLE RIGHT SHOULDER
2 series · 2 of 2 positions shown · non-contrast
Comparison: Chest radiograph 04/21/2014 and shoulder MRI 01/22/2019

CLINICAL DATA: Right shoulder arthroplasty.

EXAM:
PORTABLE RIGHT SHOULDER

[shoulder ap (1 of 2)]
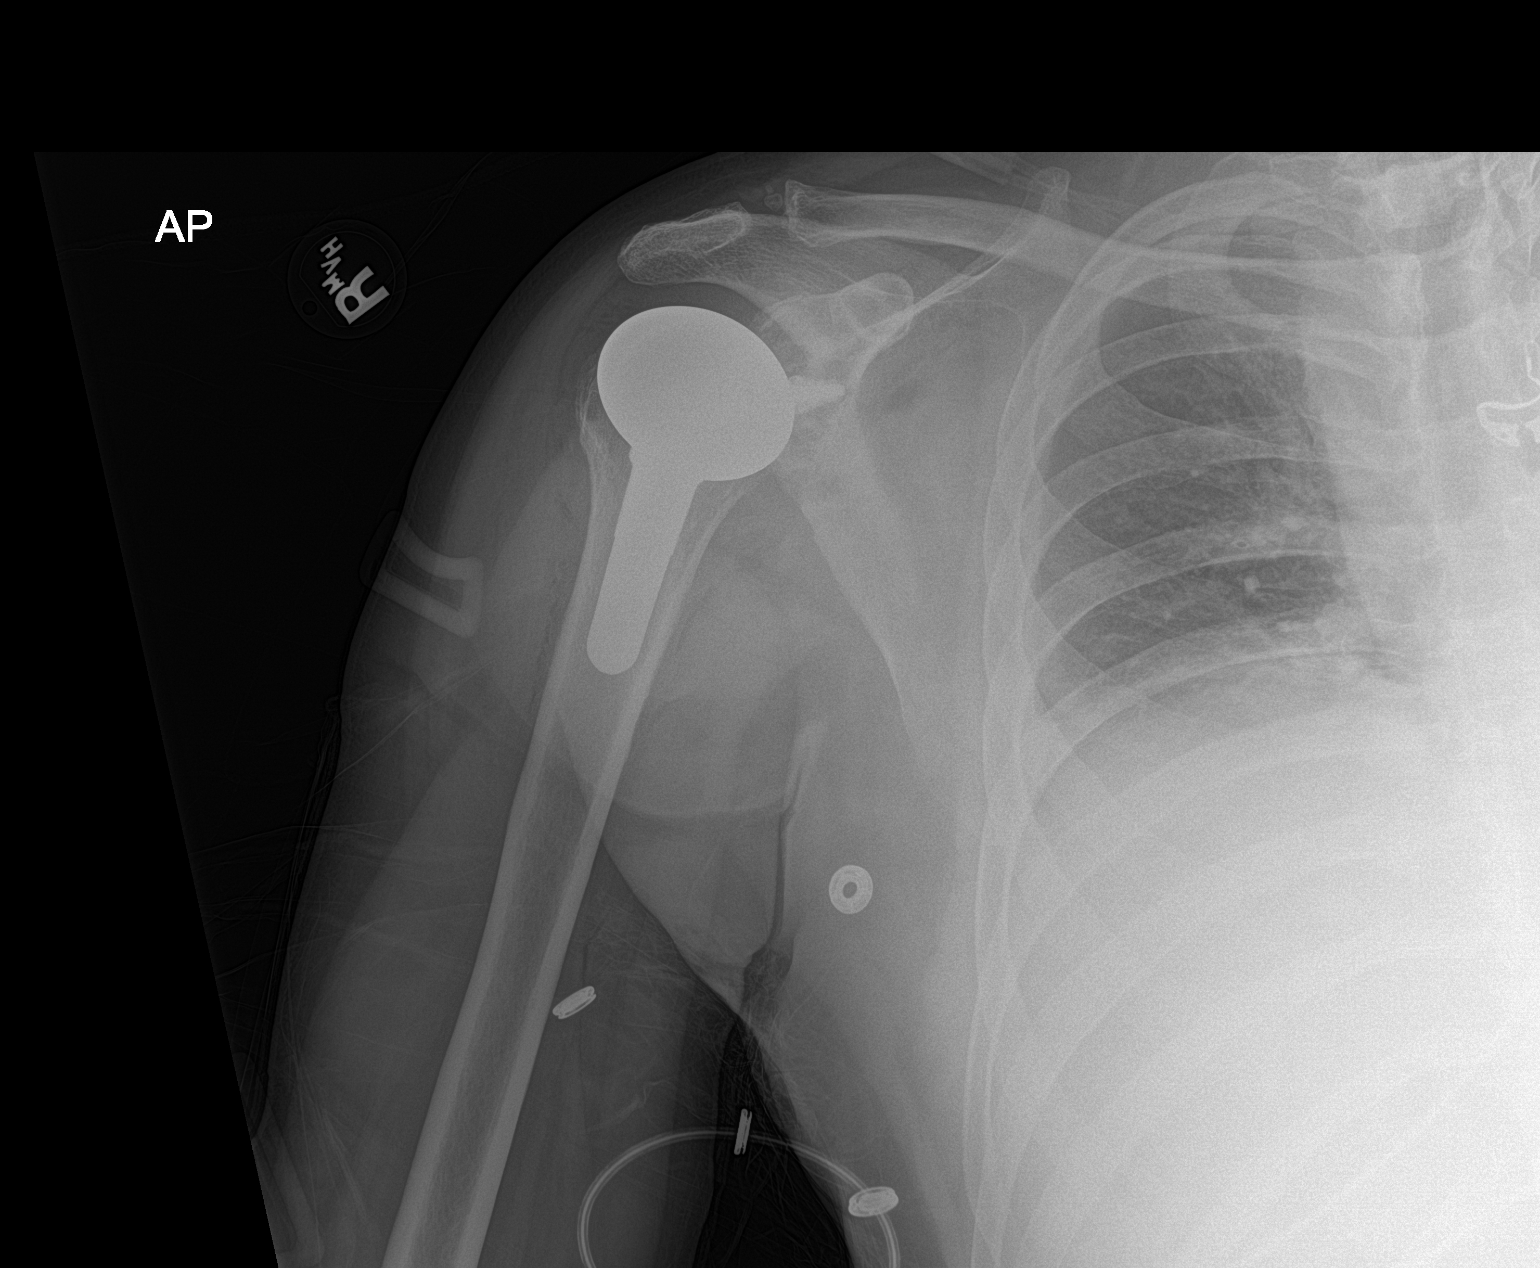

[shoulder ap (2 of 2)]
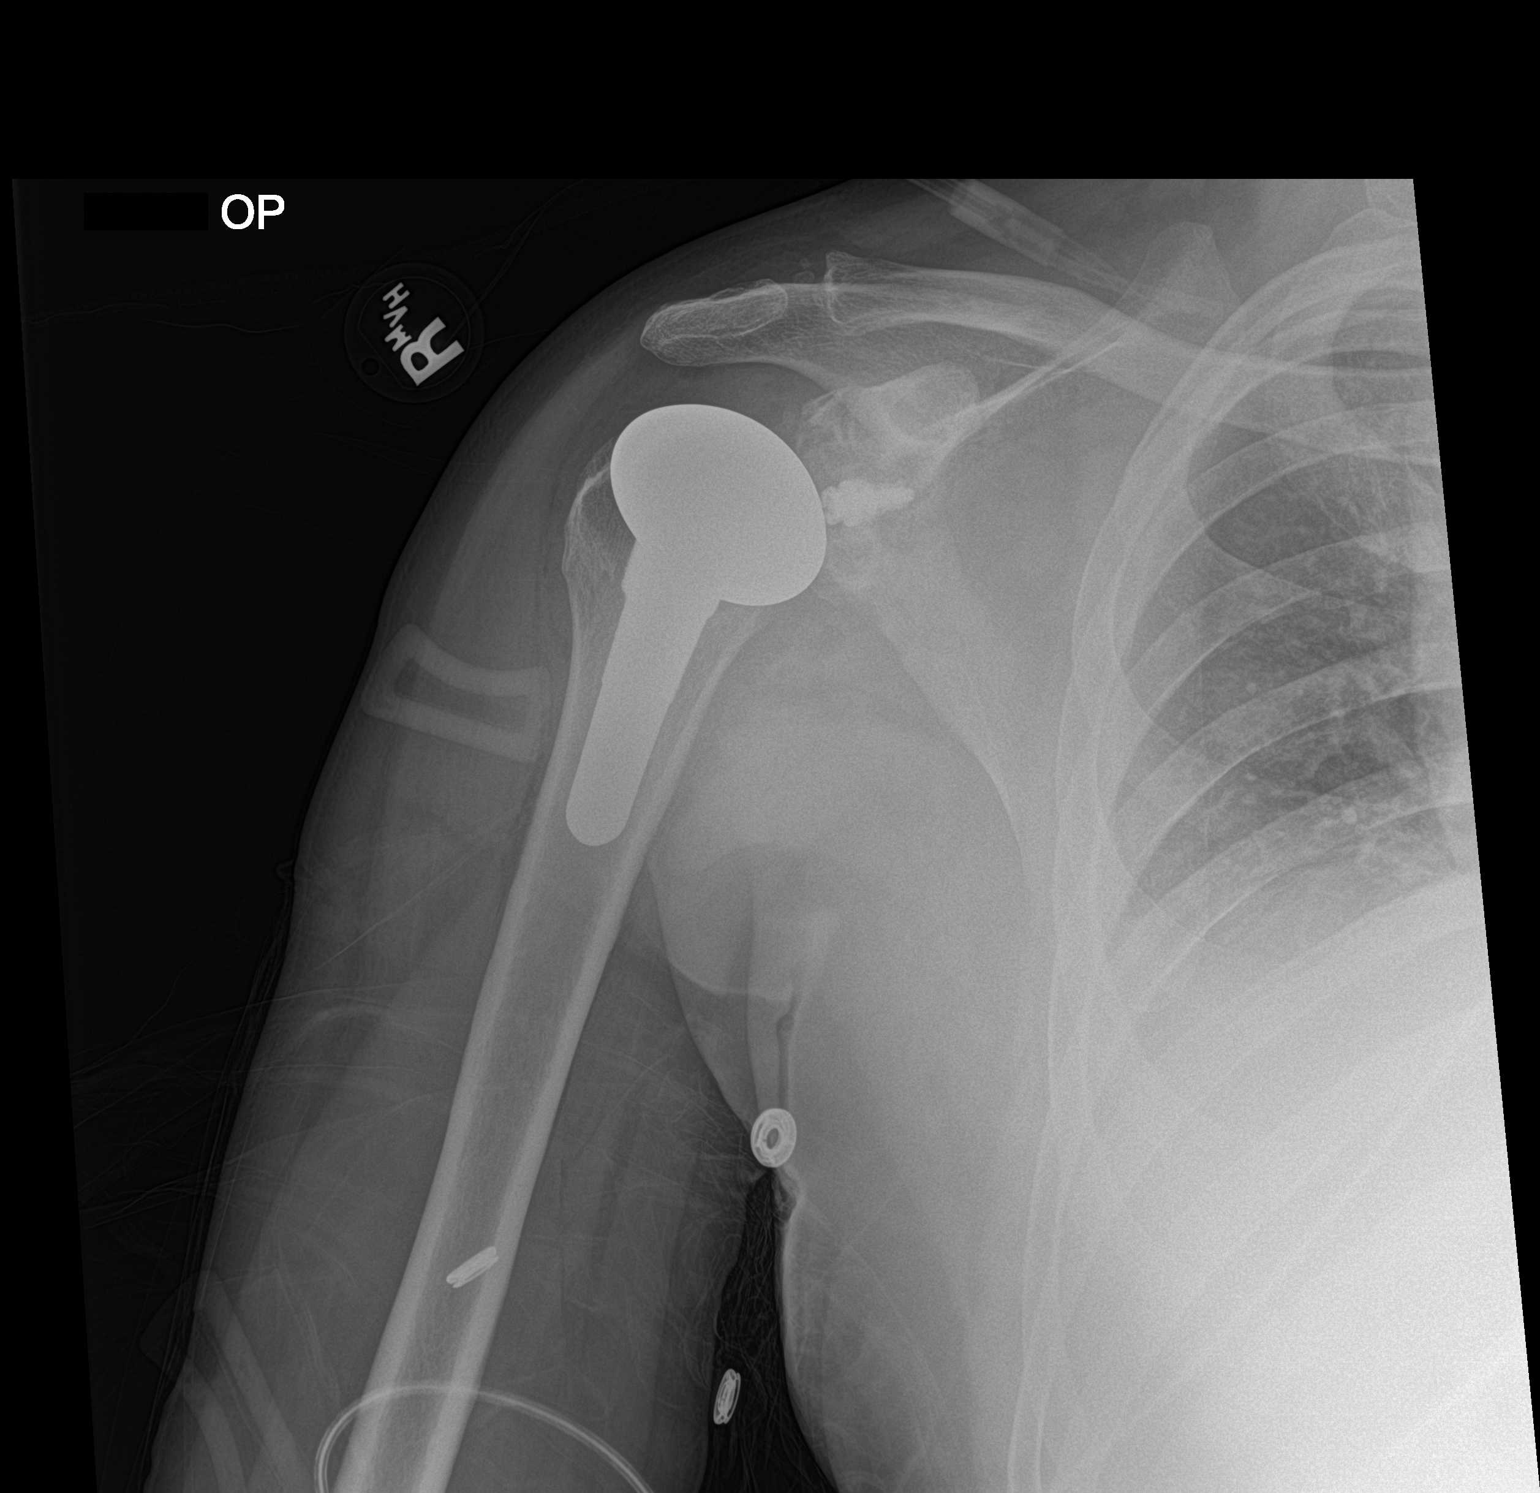

[2 of 2 positions shown; findings below may reference images not displayed]

FINDINGS: Interval placement of a right shoulder arthroplasty. The right
shoulder appears located on these images. No evidence for a
periprosthetic fracture. Mild widening at the right AC joint.
Visualized right ribs are intact. Volume loss in the right lung.
Small amount of lucency in the soft tissues compatible with recent
surgery.
IMPRESSION: Expected postop changes from right shoulder arthroplasty.

## 2023-03-26 ENCOUNTER — Other Ambulatory Visit: Payer: Self-pay | Admitting: Orthopaedic Surgery

## 2023-03-26 DIAGNOSIS — Z01818 Encounter for other preprocedural examination: Secondary | ICD-10-CM

## 2023-04-13 ENCOUNTER — Other Ambulatory Visit: Payer: 59

## 2023-04-17 ENCOUNTER — Ambulatory Visit
Admission: RE | Admit: 2023-04-17 | Discharge: 2023-04-17 | Disposition: A | Discharge status: Home / Self Care | Payer: Medicare PPO | Source: Ambulatory Visit | Attending: Orthopaedic Surgery

## 2023-04-17 DIAGNOSIS — Z01818 Encounter for other preprocedural examination: Secondary | ICD-10-CM
